# Patient Record
Sex: Female | Born: 1980 | Race: Black or African American | Hispanic: No | Marital: Married | Smoking: Never smoker
Health system: Southern US, Community
[De-identification: ages and names within clinical notes are randomized; demographics above are authoritative.]

## PROBLEM LIST (undated history)

## (undated) DIAGNOSIS — I729 Aneurysm of unspecified site: Secondary | ICD-10-CM

## (undated) DIAGNOSIS — I10 Essential (primary) hypertension: Secondary | ICD-10-CM

## (undated) HISTORY — DX: Essential (primary) hypertension: I10

## (undated) HISTORY — PX: SPLENECTOMY, TOTAL: SHX788

## (undated) HISTORY — DX: Aneurysm of unspecified site: I72.9

---

## 2000-11-11 ENCOUNTER — Ambulatory Visit (HOSPITAL_COMMUNITY): Admission: AD | Admit: 2000-11-11 | Discharge: 2000-11-11 | Payer: Self-pay | Admitting: Obstetrics and Gynecology

## 2000-11-25 ENCOUNTER — Inpatient Hospital Stay (HOSPITAL_COMMUNITY): Admission: AD | Admit: 2000-11-25 | Discharge: 2000-11-27 | Payer: Self-pay | Admitting: Obstetrics and Gynecology

## 2002-03-17 ENCOUNTER — Emergency Department (HOSPITAL_COMMUNITY): Admission: EM | Admit: 2002-03-17 | Discharge: 2002-03-17 | Payer: Self-pay | Admitting: Emergency Medicine

## 2002-04-06 ENCOUNTER — Emergency Department (HOSPITAL_COMMUNITY): Admission: EM | Admit: 2002-04-06 | Discharge: 2002-04-06 | Payer: Self-pay | Admitting: Emergency Medicine

## 2002-06-28 ENCOUNTER — Emergency Department (HOSPITAL_COMMUNITY): Admission: EM | Admit: 2002-06-28 | Discharge: 2002-06-28 | Payer: Self-pay | Admitting: Emergency Medicine

## 2003-11-28 ENCOUNTER — Ambulatory Visit (HOSPITAL_COMMUNITY): Admission: AD | Admit: 2003-11-28 | Discharge: 2003-11-28 | Payer: Self-pay | Admitting: Obstetrics and Gynecology

## 2004-01-13 ENCOUNTER — Ambulatory Visit (HOSPITAL_COMMUNITY): Admission: AD | Admit: 2004-01-13 | Discharge: 2004-01-13 | Payer: Self-pay | Admitting: Obstetrics and Gynecology

## 2004-01-19 ENCOUNTER — Ambulatory Visit (HOSPITAL_COMMUNITY): Admission: RE | Admit: 2004-01-19 | Discharge: 2004-01-19 | Payer: Self-pay | Admitting: Obstetrics and Gynecology

## 2004-03-21 ENCOUNTER — Inpatient Hospital Stay (HOSPITAL_COMMUNITY): Admission: AD | Admit: 2004-03-21 | Discharge: 2004-03-23 | Payer: Self-pay | Admitting: Obstetrics & Gynecology

## 2004-11-25 ENCOUNTER — Emergency Department (HOSPITAL_COMMUNITY): Admission: EM | Admit: 2004-11-25 | Discharge: 2004-11-25 | Payer: Self-pay | Admitting: Emergency Medicine

## 2007-10-23 ENCOUNTER — Other Ambulatory Visit: Admission: RE | Admit: 2007-10-23 | Discharge: 2007-10-23 | Payer: Self-pay | Admitting: Obstetrics and Gynecology

## 2008-03-15 ENCOUNTER — Inpatient Hospital Stay (HOSPITAL_COMMUNITY): Admission: AD | Admit: 2008-03-15 | Discharge: 2008-03-17 | Payer: Self-pay | Admitting: Obstetrics and Gynecology

## 2008-03-24 ENCOUNTER — Encounter: Payer: Self-pay | Admitting: Emergency Medicine

## 2008-03-24 ENCOUNTER — Inpatient Hospital Stay (HOSPITAL_COMMUNITY): Admission: AD | Admit: 2008-03-24 | Discharge: 2008-03-28 | Payer: Self-pay | Admitting: Neurosurgery

## 2008-05-10 ENCOUNTER — Encounter: Admission: RE | Admit: 2008-05-10 | Discharge: 2008-05-10 | Payer: Self-pay | Admitting: Neurosurgery

## 2010-04-23 ENCOUNTER — Emergency Department (HOSPITAL_COMMUNITY): Admission: EM | Admit: 2010-04-23 | Discharge: 2010-04-24 | Payer: Self-pay | Admitting: Emergency Medicine

## 2010-12-10 ENCOUNTER — Emergency Department (HOSPITAL_COMMUNITY)
Admission: EM | Admit: 2010-12-10 | Discharge: 2010-12-10 | Disposition: A | Discharge status: Home / Self Care | Payer: PRIVATE HEALTH INSURANCE | Attending: Emergency Medicine | Admitting: Emergency Medicine

## 2010-12-10 DIAGNOSIS — I10 Essential (primary) hypertension: Secondary | ICD-10-CM | POA: Insufficient documentation

## 2010-12-10 DIAGNOSIS — H9209 Otalgia, unspecified ear: Secondary | ICD-10-CM | POA: Insufficient documentation

## 2010-12-10 DIAGNOSIS — H6120 Impacted cerumen, unspecified ear: Secondary | ICD-10-CM | POA: Insufficient documentation

## 2010-12-10 DIAGNOSIS — D649 Anemia, unspecified: Secondary | ICD-10-CM | POA: Insufficient documentation

## 2010-12-15 NOTE — Discharge Summary (Signed)
[name], [name]            ACCOUNT NO.:  [id number]   MEDICAL RECORD NO.:  [id number]          PATIENT TYPE:  INP   LOCATION:  3021                         FACILITY:  [facility]   PHYSICIAN:  Cristi Loron, M.D.DATE OF BIRTH:  1981/05/30   DATE OF ADMISSION:  03/24/2008  DATE OF DISCHARGE:  03/28/2008                               DISCHARGE SUMMARY   BRIEF HISTORY:  The patient is a 30 year old black female who presented  with a subarachnoid hemorrhage to the emergency department on March 24, 2008.  She was admitted for further workup.   For further details of this admission, please refer to history and  physical.   HOSPITAL COURSE:  The patient was admitted to [facility] on  March 24, 2008, with a diagnosis of nontraumatic subarachnoid  hemorrhage.  The patient was worked up with a CT angiogram which did not  demonstrate any evidence of aneurysms, AVMs, etc. to explain her  subarachnoid rectal hemorrhage.   Dictation ended at this point.      Cristi Loron, M.D.  Electronically Signed     JDJ/MEDQ  D:  05/02/2008  T:  05/03/2008  Job:  [id number]

## 2010-12-15 NOTE — Op Note (Signed)
[name], [name]            ACCOUNT NO.:  [id number]   MEDICAL RECORD NO.:  [id number]          PATIENT TYPE:  INP   LOCATION:  9132                          FACILITY:  [facility]   PHYSICIAN:  Tilda Burrow, M.D. DATE OF BIRTH:  [birth year]   DATE OF PROCEDURE:  03/16/2008  DATE OF DISCHARGE:                               OPERATIVE REPORT   Delivery time 4:49 a.m. on March 16, 2008.   LABOR SUMMARY AND DELIVERY NOTE:  [name] was admitted shortly before  midnight at 40 weeks and 6 days for induction of labor due to impending  post date.  She was 3 cm dilated, long, -2 vertex at the time of  admission.  Shortly after midnight she was given a low-dose Pitocin  induction of labor and she received IV Stadol x2 and then IV Nubain x1.  She progressed nicely.  Scalp electrode as applied when membranes have  ruptured.  Amniotic fluid was noted later to have light meconium  discoloration.  She progressed rapidly after 4 a.m. and began to have  fetal bradycardia around 4:30 p.m. and was found anterior lip dilation.  Lip was able to be lifted out of the cervix and she pushed irresistibly  within 10 minutes to a spontaneous vertex vaginal delivery of an 8 pound  0 ounce female infant with Apgars of 9 and 9, delivered over an intact  perineum.  Bulb suctioning was easily performed and the baby cried  vigorously.  There was nuchal cord x1 slipped against the body.  The  infant's right shoulder was able to be identified and used to guide the  shoulder and the body past the introitus.  Cord blood gases were not  felt necessary given the excellent overall status of the infant.  Placenta delivered easily, shoulder presentation.  Had a normal size  placenta with a marginal insertion of the cord and chronic meconium  discoloration of the membranes and placental surface.  The infant went  to the regular nursery and mother required no lacerations with 250 mL of  EBL.      Tilda Burrow, M.D.  Electronically Signed     JVF/MEDQ  D:  03/16/2008  T:  03/17/2008  Job:  [id number]   cc:   [facility]   Francoise Schaumann. Milford Cage DO, FAAP  Fax: [phone]
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
## 2010-12-15 NOTE — Discharge Summary (Signed)
[name], [name]            ACCOUNT NO.:  [id number]   MEDICAL RECORD NO.:  [id number]          PATIENT TYPE:  INP   LOCATION:  3021                         FACILITY:  [facility]   PHYSICIAN:  Cristi Loron, M.D.DATE OF BIRTH:  02-28-1981   DATE OF ADMISSION:  03/24/2008  DATE OF DISCHARGE:  03/28/2008                               DISCHARGE SUMMARY   ADDENDUM:  The patient continued to have headaches.  She was observed  for several days.  We obtained a brain MRI which did not demonstrate any  clear cause for subarachnoid hemorrhage.   By March 28, 2008, the patient was afebrile.  Vital signs were stable.  Headache had been improving.  She was requesting discharge home.  She  was discharged home on April 28, 2008.   DISCHARGE INSTRUCTIONS:  The patient is instructed to follow up with me  in office in a month.  She was given written discharge instructions.   DISCHARGE PRESCRIPTIONS:  Percocet 10/325 #100 one p.o. q.4 h. p.r.n.  for pain.   FINAL DIAGNOSES:  Nontraumatic subarachnoid hemorrhage.   PROCEDURES PERFORMED:  None.      Cristi Loron, M.D.  Electronically Signed     JDJ/MEDQ  D:  05/02/2008  T:  05/03/2008  Job:  [id number]
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
## 2010-12-18 NOTE — Op Note (Signed)
[name], [name]                        ACCOUNT NO.:  [id number]   MEDICAL RECORD NO.:  [id number]                   PATIENT TYPE:  INP   LOCATION:  A427                                 FACILITY:  [facility]   PHYSICIAN:  Lazaro Arms, M.D.                DATE OF BIRTH:  05-Jul-1981   DATE OF PROCEDURE:  03/21/2004  DATE OF DISCHARGE:                                 OPERATIVE REPORT   DESCRIPTION OF PROCEDURE:  The patient is a 30 year old Gravida II, Para 1  who presented in the active phase of labor.  She has progressed normally  through the active phase.  She received a small amount of Pitocin  augmentation.  She was found to be complete and had the urge to push.   The patient had two maternal expulsive efforts and over an intact perineum  delivered a viable female at 12:30 with Apgars of 9 and 9 with a weight yet  to be determined.  There was a three vessel cord.  Cord blood and cord gas  were sent.  The placenta was normal and intact.  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  There was a small right periurethral laceration which was repaired with  three interrupted sutures.  The perineum was otherwise intact.  The uterus  was firm.  Blood loss from the delivery was 200 mL.  The patient is Rh  positive and Rubella immune.  She will undergo routine postpartum care.      ___________________________________________                                            Lazaro Arms, M.D.   LHE/MEDQ  D:  03/21/2004  T:  03/22/2004  Job:  [id number]

## 2011-04-30 LAB — CBC
MCHC: 32.2
MCV: 87.9
Platelets: 503 — ABNORMAL HIGH

## 2011-04-30 LAB — RPR: RPR Ser Ql: NONREACTIVE

## 2014-07-10 ENCOUNTER — Ambulatory Visit (INDEPENDENT_AMBULATORY_CARE_PROVIDER_SITE_OTHER): Payer: PRIVATE HEALTH INSURANCE | Admitting: Advanced Practice Midwife

## 2014-07-10 ENCOUNTER — Other Ambulatory Visit (HOSPITAL_COMMUNITY)
Admission: RE | Admit: 2014-07-10 | Discharge: 2014-07-10 | Disposition: A | Discharge status: Home / Self Care | Payer: PRIVATE HEALTH INSURANCE | Source: Ambulatory Visit | Attending: Advanced Practice Midwife | Admitting: Advanced Practice Midwife

## 2014-07-10 ENCOUNTER — Encounter: Payer: Self-pay | Admitting: Advanced Practice Midwife

## 2014-07-10 VITALS — BP 120/80 | Ht 62.5 in | Wt 194.0 lb

## 2014-07-10 DIAGNOSIS — Z01419 Encounter for gynecological examination (general) (routine) without abnormal findings: Secondary | ICD-10-CM | POA: Diagnosis present

## 2014-07-10 DIAGNOSIS — Z131 Encounter for screening for diabetes mellitus: Secondary | ICD-10-CM

## 2014-07-10 DIAGNOSIS — Z1151 Encounter for screening for human papillomavirus (HPV): Secondary | ICD-10-CM | POA: Diagnosis present

## 2014-07-10 DIAGNOSIS — Z13220 Encounter for screening for lipoid disorders: Secondary | ICD-10-CM

## 2014-07-10 DIAGNOSIS — I10 Essential (primary) hypertension: Secondary | ICD-10-CM | POA: Insufficient documentation

## 2014-07-10 DIAGNOSIS — Z0000 Encounter for general adult medical examination without abnormal findings: Secondary | ICD-10-CM

## 2014-07-10 DIAGNOSIS — Z1329 Encounter for screening for other suspected endocrine disorder: Secondary | ICD-10-CM

## 2014-07-10 MED ORDER — NORGESTIM-ETH ESTRAD TRIPHASIC 0.18/0.215/0.25 MG-25 MCG PO TABS: 1.0000 | ORAL_TABLET | Freq: Every day | ORAL | Status: DC

## 2014-07-10 MED ORDER — HYDROCHLOROTHIAZIDE 12.5 MG PO CAPS: 12.5000 mg | ORAL_CAPSULE | Freq: Every day | ORAL | Status: DC

## 2014-07-10 NOTE — Progress Notes (Signed)
[name] 33 y.o.  Filed Vitals:   07/10/14 0848  BP: 120/80     Past Medical History: Past Medical History  Diagnosis Date  . Aneurysm   . Hypertension     Past Surgical History: Past Surgical History  Procedure Laterality Date  . Splenectomy, total      Family History: Family History  Problem Relation Age of Onset  . Diabetes Mother   . Hypertension Mother   . Diabetes Father   . Hypertension Father   . Asthma Daughter     Social History: History  Substance Use Topics  . Smoking status: Never Smoker   . Smokeless tobacco: Never Used  . Alcohol Use: No    Allergies: No Known Allergies   Current outpatient prescriptions: hydrochlorothiazide (MICROZIDE) 12.5 MG capsule, Take 12.5 mg by mouth daily., Disp: , Rfl:   History of Present Illness: Here for pap and physical.  No C/O.  Husband getting out of jail in a few weeks, wants to start COC's.  Aware that she will have to use condoms for the first month.  Has been on HCTZ 12.5mg  for HTN for 15 years (Dr. Sudie BaileyKnowlton).  Requests refills.   Review of Systems   Patient denies any headaches, blurred vision, shortness of breath, chest pain, abdominal pain, problems with bowel movements, urination, or intercourse.   Physical Exam: General:  Well developed, well nourished, no acute distress Skin:  Warm and dry Neck:  Midline trachea, normal thyroid Lungs; Clear to auscultation bilaterally Breast:  No dominant palpable mass, retraction, or nipple discharge Cardiovascular: Regular rate and rhythm Abdomen:  Soft, non tender, no hepatosplenomegaly Pelvic:  External genitalia is normal in appearance.  The vagina is normal in appearance.  The cervix is bulbous.  Uterus is felt to be normal size, shape, and contour.  No adnexal masses or tenderness noted.  Extremities:  No swelling or varicosities noted Psych:  No mood changes.     Impression: Normal GYN exam Contraception management     Plan: Start  Ortho Tricyclen Lo with next period  TSH, HgbA1C, CMP, CBC, Lipid profile (Lab backed up and pt couldn't wait)--will need to make an appt for 3 months for med check

## 2014-07-11 LAB — CYTOLOGY - PAP

## 2014-07-18 ENCOUNTER — Other Ambulatory Visit: Payer: Self-pay | Admitting: Adult Health

## 2014-07-24 ENCOUNTER — Telehealth: Payer: Self-pay | Admitting: Advanced Practice Midwife

## 2014-07-24 MED ORDER — NORGESTIMATE-ETH ESTRADIOL 0.25-35 MG-MCG PO TABS: 1.0000 | ORAL_TABLET | Freq: Every day | ORAL | Status: DC

## 2014-07-24 NOTE — Telephone Encounter (Signed)
sprintec called in..Marland Kitchen

## 2014-07-24 NOTE — Telephone Encounter (Signed)
Pt is wanting a cheaper BCP, she states her insurance doesn't cover much of the cost and the pharmacist recommended she call us and ask for a generic to be prescribed.fr

## 2015-08-07 ENCOUNTER — Other Ambulatory Visit: Payer: Self-pay | Admitting: Advanced Practice Midwife

## 2015-08-23 ENCOUNTER — Other Ambulatory Visit: Payer: Self-pay | Admitting: Advanced Practice Midwife

## 2015-09-30 ENCOUNTER — Other Ambulatory Visit: Payer: Self-pay | Admitting: *Deleted

## 2015-10-01 MED ORDER — NORGESTIMATE-ETH ESTRADIOL 0.25-35 MG-MCG PO TABS: 1.0000 | ORAL_TABLET | Freq: Every day | ORAL | Status: DC

## 2015-10-08 ENCOUNTER — Other Ambulatory Visit: Payer: Self-pay | Admitting: Advanced Practice Midwife

## 2015-10-29 ENCOUNTER — Telehealth: Payer: Self-pay | Admitting: Women's Health

## 2015-10-29 ENCOUNTER — Other Ambulatory Visit: Payer: PRIVATE HEALTH INSURANCE | Admitting: Women's Health

## 2015-10-29 ENCOUNTER — Other Ambulatory Visit: Payer: Self-pay | Admitting: Advanced Practice Midwife

## 2015-10-29 NOTE — Telephone Encounter (Signed)
Pt informed Rx for Sprintec e-scribed on 10/01/2015 with 6 refills. Pt verbalized understanding.

## 2015-10-29 NOTE — Telephone Encounter (Signed)
Pt called stating that she had to reschedule her appointment because she has started her period, Pt would like to know if we could refill her medication until her next appointment which is for 11/03/2015 @ 10:45. Please contact pt

## 2015-11-03 ENCOUNTER — Ambulatory Visit (INDEPENDENT_AMBULATORY_CARE_PROVIDER_SITE_OTHER): Payer: PRIVATE HEALTH INSURANCE | Admitting: Women's Health

## 2015-11-03 ENCOUNTER — Encounter: Payer: Self-pay | Admitting: Women's Health

## 2015-11-03 VITALS — BP 112/60 | HR 80 | Ht 62.0 in | Wt 198.0 lb

## 2015-11-03 DIAGNOSIS — Z01419 Encounter for gynecological examination (general) (routine) without abnormal findings: Secondary | ICD-10-CM | POA: Diagnosis not present

## 2015-11-03 DIAGNOSIS — L723 Sebaceous cyst: Secondary | ICD-10-CM | POA: Insufficient documentation

## 2015-11-03 HISTORY — DX: Sebaceous cyst: L72.3

## 2015-11-03 NOTE — Progress Notes (Signed)
Patient ID: [name], female   DOB: 1981-06-22, [age] y.o.   MRN: [id number] Subjective:   [name] is a 35 y.o. African American female here for a routine well-woman exam.  Patient's last menstrual period was 09/29/2015.    Current complaints: knot under Rt armpit x few months- no changes, not painful PCP: Dr. 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  Past Medical History Past Medical History  Diagnosis Date  . Aneurysm (HCC)   . Hypertension     Past Surgical History Past Surgical History  Procedure Laterality Date  . Splenectomy, total      Gynecologic History No obstetric history on file.  Patient's last menstrual period was 09/29/2015. Contraception: OCP (estrogen/progesterone) Last Pap: 07/10/14. Results were: normal Last mammogram: never. Results were: n/a Last TCS: never  Obstetric History OB History  No data available    Current Medications Current Outpatient Prescriptions on File Prior to Visit  Medication Sig Dispense Refill  . hydrochlorothiazide (MICROZIDE) 12.5 MG capsule TAKE 1 CAPSULE BY MOUTH ONCE A DAY FOR HIGH BLOOD PRESSURE. 30 capsule 6  . norgestimate-ethinyl estradiol (SPRINTEC 28) 0.25-35 MG-MCG tablet Take 1  tablet by mouth daily. 28 tablet 6  . Norgestimate-Ethinyl Estradiol Triphasic 0.18/0.215/0.25 MG-25 MCG tab Take 1 tablet by mouth daily. (Patient not taking: Reported on 11/03/2015) 1 Package 11   No current facility-administered medications on file prior to visit.    Review of Systems Patient denies any headaches, blurred vision, shortness of breath, chest pain, abdominal pain, problems with bowel movements, urination, or intercourse.  Objective:  BP 112/60 mmHg  Pulse 80  Ht 5\' 2"  (1.575 m)  Wt 198 lb (89.812 kg)  BMI 36.21 kg/m2  LMP 09/29/2015 Physical Exam  General:  Well developed, well nourished, no acute distress. She is alert and oriented x3. Skin:  Warm and dry Neck:  Midline trachea, no thyromegaly or nodules Cardiovascular: Regular rate and rhythm, no murmur heard Lungs:  Effort normal, all lung fields clear to auscultation bilaterally Breasts:  No dominant palpable mass, retraction, or nipple discharge ~0.5cm round hard mobile cyst under Rt axilla, able to pick it up- does not feel like lymph node Abdomen:  Soft, non tender, no hepatosplenomegaly or masses Pelvic:  External genitalia is normal in appearance.  The vagina is normal in appearance. The cervix is bulbous, no CMT.  Thin prep pap is not done . Uterus is felt to be normal size, shape, and contour.  No adnexal masses or tenderness noted. Extremities:  No swelling or varicosities noted Psych:  She has a normal mood and affect   Assessment:   Healthy well-woman exam Sebacceous cyst Rt axilla CHTN  Plan:  Fasting CBC, CMP, TSH, A1C, Lipid panel in am Let us know if cyst changing F/U 1180yr for pap &  physical, or sooner if needed Mammogram  or sooner if problems Colonoscopy  or sooner if problems  Marge Duncans CNM, WHNP-BC 11/03/2015 11:30 AM

## 2015-11-03 NOTE — Patient Instructions (Signed)
Lab [facility]   Mon-Friday 8am-5pm (close daily 12:30-1pm for lunch), Sat 8am-12pm

## 2016-03-19 ENCOUNTER — Other Ambulatory Visit: Payer: Self-pay | Admitting: Advanced Practice Midwife

## 2016-05-05 ENCOUNTER — Telehealth (HOSPITAL_COMMUNITY): Payer: Self-pay

## 2016-05-05 NOTE — Telephone Encounter (Signed)
PT l/m to call and schedule a new pt appt / Attempted to call pt about an appt / No answer and not able to l/m on v/mail.

## 2016-10-02 ENCOUNTER — Other Ambulatory Visit: Payer: Self-pay | Admitting: Advanced Practice Midwife

## 2016-11-19 ENCOUNTER — Other Ambulatory Visit: Payer: Self-pay | Admitting: Advanced Practice Midwife

## 2017-01-29 ENCOUNTER — Other Ambulatory Visit: Payer: Self-pay | Admitting: Advanced Practice Midwife

## 2017-02-12 ENCOUNTER — Other Ambulatory Visit: Payer: Self-pay | Admitting: Advanced Practice Midwife

## 2017-09-28 ENCOUNTER — Other Ambulatory Visit: Payer: Self-pay | Admitting: Advanced Practice Midwife

## 2017-10-03 ENCOUNTER — Other Ambulatory Visit: Payer: Self-pay | Admitting: *Deleted

## 2017-10-03 MED ORDER — NORGESTIMATE-ETH ESTRADIOL 0.25-35 MG-MCG PO TABS: 1.0000 | ORAL_TABLET | Freq: Every day | ORAL | 0 refills | Status: DC

## 2017-10-04 NOTE — Telephone Encounter (Signed)
Attempted to call pt to let her know that one refill for sprintec had been sent in to pharmacy. She will need to schedule appt for physical for any more refills.

## 2017-10-27 ENCOUNTER — Other Ambulatory Visit: Payer: Self-pay | Admitting: Advanced Practice Midwife

## 2017-10-27 ENCOUNTER — Telehealth: Payer: Self-pay | Admitting: Advanced Practice Midwife

## 2017-10-27 ENCOUNTER — Other Ambulatory Visit: Payer: PRIVATE HEALTH INSURANCE | Admitting: Advanced Practice Midwife

## 2017-10-27 MED ORDER — NORGESTIMATE-ETH ESTRADIOL 0.25-35 MG-MCG PO TABS: 1.0000 | ORAL_TABLET | Freq: Every day | ORAL | 0 refills | Status: DC

## 2017-10-27 NOTE — Telephone Encounter (Signed)
Patient called stating that she had to reschedule her appointment for today because she has started her menstrual, pt would like to know if we could call [facility] next appointment which is 11/03/2017. Pt states that she uses [facility]. Please contact pt

## 2017-11-03 ENCOUNTER — Encounter: Payer: Self-pay | Admitting: Advanced Practice Midwife

## 2017-11-03 ENCOUNTER — Other Ambulatory Visit (HOSPITAL_COMMUNITY)
Admission: RE | Admit: 2017-11-03 | Discharge: 2017-11-03 | Disposition: A | Discharge status: Home / Self Care | Payer: 59 | Source: Ambulatory Visit | Attending: Advanced Practice Midwife | Admitting: Advanced Practice Midwife

## 2017-11-03 ENCOUNTER — Ambulatory Visit (INDEPENDENT_AMBULATORY_CARE_PROVIDER_SITE_OTHER): Payer: 59 | Admitting: Advanced Practice Midwife

## 2017-11-03 VITALS — BP 126/90 | HR 81 | Ht 63.0 in | Wt 200.0 lb

## 2017-11-03 DIAGNOSIS — Z1329 Encounter for screening for other suspected endocrine disorder: Secondary | ICD-10-CM

## 2017-11-03 DIAGNOSIS — Z131 Encounter for screening for diabetes mellitus: Secondary | ICD-10-CM

## 2017-11-03 DIAGNOSIS — Z13220 Encounter for screening for lipoid disorders: Secondary | ICD-10-CM | POA: Diagnosis not present

## 2017-11-03 DIAGNOSIS — Z01419 Encounter for gynecological examination (general) (routine) without abnormal findings: Secondary | ICD-10-CM

## 2017-11-03 MED ORDER — NORGESTIMATE-ETH ESTRADIOL 0.25-35 MG-MCG PO TABS: 1.0000 | ORAL_TABLET | Freq: Every day | ORAL | 12 refills | Status: DC

## 2017-11-03 NOTE — Progress Notes (Signed)
[name] 37 y.o.  Vitals:   11/03/17 1528  BP: 126/90  Pulse: 81     Filed Weights   11/03/17 1528  Weight: 200 lb (90.7 kg)    Past Medical History: Past Medical History:  Diagnosis Date  . Aneurysm (HCC)   . Hypertension     Past Surgical History: Past Surgical History:  Procedure Laterality Date  . SPLENECTOMY, TOTAL      Family History: Family History  Problem Relation Age of Onset  . Diabetes Mother   . Hypertension Mother   . Diabetes Father   . Hypertension Father   . Asthma Daughter     Social History: Social History   Tobacco Use  . Smoking status: Never Smoker  . Smokeless tobacco: Never Used  Substance Use Topics  . Alcohol use: No    Alcohol/week: 0.0 oz  . Drug use: No    Allergies: No Known Allergies    Current Outpatient Medications:  .  norgestimate-ethinyl estradiol (SPRINTEC 28) 0.25-35 MG-MCG tablet, Take 1 tablet by mouth daily., Disp: 28 tablet, Rfl: 0 .  hydrochlorothiazide (MICROZIDE) 12.5 MG capsule, TAKE 1 CAPSULE BY MOUTH ONCE A DAY FOR HIGH BLOOD PRESSURE. (Patient not taking: Reported on 11/03/2017), Disp: 30 capsule, Rfl: 6 .  Norgestimate-Ethinyl Estradiol Triphasic 0.18/0.215/0.25 MG-25 MCG tab, Take 1 tablet by mouth daily. (Patient not taking: Reported on 11/03/2015), Disp: 1 Package, Rfl: 11 .  phentermine 37.5 MG capsule, Take 37.5 mg by mouth every morning., Disp: , Rfl:   History of Present Illness: Here for pap Last pap end of 2015.    Review of Systems   Patient denies any headaches, blurred vision, shortness of breath, chest pain, abdominal pain, problems with bowel movements, urination, or intercourse.   Physical Exam: General:  Well developed, well nourished, no acute distress Skin:  Warm and dry Neck:  Midline trachea, normal thyroid Lungs; Clear to auscultation bilaterally Breast:  No dominant palpable mass, retraction, or nipple discharge Cardiovascular: Regular rate and rhythm Abdomen:   Soft, non tender, no hepatosplenomegaly Pelvic:  External genitalia is normal in appearance.  The vagina is normal in appearance.  The cervix is bulbous.  Uterus is felt to be normal size, shape, and contour.  No adnexal masses or tenderness noted.  Extremities:  No swelling or varicosities noted Psych:  No mood changes.     Impression: normal Pap and physical     Plan: if normal, repeat q 3 years Screening labs

## 2017-11-03 NOTE — Patient Instructions (Signed)
RepHresh a few days before your period.

## 2017-11-04 LAB — CBC
HEMATOCRIT: 32.1 % — AB (ref 34.0–46.6)
HEMOGLOBIN: 9.8 g/dL — AB (ref 11.1–15.9)
MCH: 25.7 pg — AB (ref 26.6–33.0)
MCHC: 30.5 g/dL — AB (ref 31.5–35.7)
MCV: 84 fL (ref 79–97)
Platelets: 507 10*3/uL — ABNORMAL HIGH (ref 150–379)
RBC: 3.82 x10E6/uL (ref 3.77–5.28)
RDW: 15.8 % — AB (ref 12.3–15.4)
WBC: 4.1 10*3/uL (ref 3.4–10.8)

## 2017-11-04 LAB — HEMOGLOBIN A1C
ESTIMATED AVERAGE GLUCOSE: 97 mg/dL
HEMOGLOBIN A1C: 5 % (ref 4.8–5.6)

## 2017-11-04 LAB — LIPID PANEL
CHOL/HDL RATIO: 3.3 ratio (ref 0.0–4.4)
Cholesterol, Total: 246 mg/dL — ABNORMAL HIGH (ref 100–199)
HDL: 74 mg/dL (ref >39)
LDL Calculated: 161 mg/dL — ABNORMAL HIGH (ref 0–99)
TRIGLYCERIDES: 55 mg/dL (ref 0–149)
VLDL Cholesterol Cal: 11 mg/dL (ref 5–40)

## 2017-11-04 LAB — COMPREHENSIVE METABOLIC PANEL
ALBUMIN: 3.8 g/dL (ref 3.5–5.5)
ALT: 8 IU/L (ref 0–32)
AST: 17 IU/L (ref 0–40)
Albumin/Globulin Ratio: 1.1 — ABNORMAL LOW (ref 1.2–2.2)
Alkaline Phosphatase: 70 IU/L (ref 39–117)
BILIRUBIN TOTAL: 0.5 mg/dL (ref 0.0–1.2)
BUN / CREAT RATIO: 11 (ref 9–23)
BUN: 8 mg/dL (ref 6–20)
CALCIUM: 9 mg/dL (ref 8.7–10.2)
CHLORIDE: 101 mmol/L (ref 96–106)
CO2: 21 mmol/L (ref 20–29)
CREATININE: 0.74 mg/dL (ref 0.57–1.00)
GFR, EST AFRICAN AMERICAN: 120 mL/min/{1.73_m2} (ref >59)
GFR, EST NON AFRICAN AMERICAN: 104 mL/min/{1.73_m2} (ref >59)
GLUCOSE: 73 mg/dL (ref 65–99)
Globulin, Total: 3.6 g/dL (ref 1.5–4.5)
Potassium: 4.1 mmol/L (ref 3.5–5.2)
Sodium: 138 mmol/L (ref 134–144)
TOTAL PROTEIN: 7.4 g/dL (ref 6.0–8.5)

## 2017-11-04 LAB — TSH: TSH: 1.43 u[IU]/mL (ref 0.450–4.500)

## 2017-11-08 LAB — CYTOLOGY - PAP
Chlamydia: NEGATIVE
Diagnosis: NEGATIVE
HPV: NOT DETECTED
Neisseria Gonorrhea: NEGATIVE

## 2017-11-09 ENCOUNTER — Encounter (INDEPENDENT_AMBULATORY_CARE_PROVIDER_SITE_OTHER): Payer: Self-pay

## 2017-11-23 ENCOUNTER — Encounter: Payer: Self-pay | Admitting: Advanced Practice Midwife

## 2017-11-23 NOTE — Progress Notes (Signed)
Saw pt at a track meet and went over labs. Advised fish oil. Pt agreed.

## 2018-10-17 ENCOUNTER — Other Ambulatory Visit: Payer: Self-pay | Admitting: Advanced Practice Midwife

## 2018-11-16 ENCOUNTER — Telehealth: Payer: Self-pay | Admitting: Advanced Practice Midwife

## 2018-11-16 NOTE — Telephone Encounter (Signed)
Pt states that she will be out of birth control next month and is wanting to see if she needs an appt or if a refill can be sent in to [facility]?

## 2018-11-20 ENCOUNTER — Other Ambulatory Visit: Payer: Self-pay

## 2018-11-20 ENCOUNTER — Encounter: Payer: Self-pay | Admitting: Women's Health

## 2018-11-20 ENCOUNTER — Ambulatory Visit (INDEPENDENT_AMBULATORY_CARE_PROVIDER_SITE_OTHER): Payer: 59 | Admitting: Women's Health

## 2018-11-20 VITALS — Ht 64.0 in | Wt 196.0 lb

## 2018-11-20 DIAGNOSIS — Z3041 Encounter for surveillance of contraceptive pills: Secondary | ICD-10-CM

## 2018-11-20 MED ORDER — NORGESTIM-ETH ESTRAD TRIPHASIC 0.18/0.215/0.25 MG-25 MCG PO TABS: 1.0000 | ORAL_TABLET | Freq: Every day | ORAL | 11 refills | Status: DC

## 2018-11-20 NOTE — Progress Notes (Signed)
   TELEHEALTH VIRTUAL GYN VISIT ENCOUNTER NOTE Patient name: [name] MRN [id number]  Date of birth: [birth year]  I connected with patient on 11/20/18 at  8:45 AM EDT by phone (couldn't get her webex to work) and verified that I am speaking with the correct person using two identifiers.  Due to COVID-19 recommendations, pt is not currently in the office.    I discussed the limitations, risks, security and privacy concerns of performing an evaluation and management service by telephone and the availability of in person appointments. I also discussed with the patient that there may be a patient responsible charge related to this service. The patient expressed understanding and agreed to proceed.   Chief Complaint:   Medication Refill (Sprintec)  History of Present Illness:   [name] is a 38 y.o.  African American female being evaluated today for contraception management. Needs refill on Sprintec, doing well, no problems. H/O HTN, not on meds for awhile, bp's have been normal, no headaches. Does not smoke, no h/o DVT/PE, CVA, MI, or migraines w/ aura.      Patient's last menstrual period was 10/25/2018. The current method of family planning is OCP (estrogen/progesterone). Last pap April 2019. Results were:  normal Review of Systems:   Pertinent items are noted in HPI Denies fever/chills, dizziness, headaches, visual disturbances, fatigue, shortness of breath, chest pain, abdominal pain, vomiting, abnormal vaginal discharge/itching/odor/irritation, problems with periods, bowel movements, urination, or intercourse unless otherwise stated above.  Pertinent History Reviewed:  Reviewed past medical,surgical, social, obstetrical and family history.  Reviewed problem list, medications and allergies. Physical Assessment:   Vitals:   11/20/18 0900  Weight: 196 lb (88.9 kg)  Height: 5\' 4"  (1.626 m)  Body mass index is 33.64 kg/m.       Physical Examination:   General:   Alert, oriented and cooperative.   Mental Status: Normal mood and affect perceived. Normal judgment and thought content.  Physical exam deferred due to nature of the encounter  No results found for this or any previous visit (from the past 24 hour(s)).  Assessment & Plan:  1) Contraception management> refilled sprintec x 74yr  Meds:  Meds ordered this encounter  Medications  . Norgestimate-Ethinyl Estradiol Triphasic 0.18/0.215/0.25 MG-25 MCG tab    Sig: Take 1 tablet by mouth daily.    Dispense:  1 Package    Refill:  11    Order Specific Question:   Supervising Provider    Answer:   Despina Hidden, LUTHER H [[id number]]    No orders of the defined types were placed in this encounter.   I discussed the assessment and treatment plan with the patient. The patient was provided an opportunity to ask questions and all were answered. The patient agreed with the plan and demonstrated an understanding of the instructions.   The patient was advised to call back or seek an in-person evaluation/go to the ED if the symptoms worsen or if the condition fails to improve as anticipated.  I provided 8 minutes of non-face-to-face time during this encounter.   Return in about 1 year (around 11/20/2019) for Physical.  Cheral Marker CNM, WHNP-BC 11/20/2018 9:22 AM

## 2019-09-28 ENCOUNTER — Other Ambulatory Visit: Payer: Self-pay | Admitting: Advanced Practice Midwife

## 2019-10-03 ENCOUNTER — Telehealth: Payer: Self-pay | Admitting: *Deleted

## 2019-10-03 ENCOUNTER — Encounter: Payer: Self-pay | Admitting: *Deleted

## 2019-10-03 NOTE — Telephone Encounter (Signed)
Pt aware fax did not go through to [facility]. Pt will come by office and pick up Friday. JSY

## 2019-10-03 NOTE — Telephone Encounter (Signed)
Faxed note to [facility]. Unable to let pt know. Her voicemail was not set up. JSY

## 2019-10-03 NOTE — Telephone Encounter (Signed)
Pt states that her insurance company [employer] needs a note on letter head stating that she uses her birth control pill for birth control only so that she can get it for free.

## 2019-10-03 NOTE — Telephone Encounter (Signed)
Pt aware of below message. 

## 2020-02-11 ENCOUNTER — Emergency Department (HOSPITAL_COMMUNITY)
Admission: EM | Admit: 2020-02-11 | Discharge: 2020-02-11 | Disposition: A | Discharge status: Home / Self Care | Payer: 59 | Source: Home / Self Care | Attending: Emergency Medicine | Admitting: Emergency Medicine

## 2020-02-11 ENCOUNTER — Encounter (HOSPITAL_COMMUNITY): Payer: Self-pay | Admitting: Emergency Medicine

## 2020-02-11 ENCOUNTER — Emergency Department (HOSPITAL_COMMUNITY): Payer: 59

## 2020-02-11 ENCOUNTER — Other Ambulatory Visit: Payer: Self-pay

## 2020-02-11 DIAGNOSIS — R1013 Epigastric pain: Secondary | ICD-10-CM | POA: Diagnosis not present

## 2020-02-11 DIAGNOSIS — J069 Acute upper respiratory infection, unspecified: Secondary | ICD-10-CM | POA: Insufficient documentation

## 2020-02-11 DIAGNOSIS — J398 Other specified diseases of upper respiratory tract: Secondary | ICD-10-CM

## 2020-02-11 DIAGNOSIS — K56600 Partial intestinal obstruction, unspecified as to cause: Secondary | ICD-10-CM | POA: Diagnosis not present

## 2020-02-11 DIAGNOSIS — I10 Essential (primary) hypertension: Secondary | ICD-10-CM | POA: Insufficient documentation

## 2020-02-11 MED ORDER — BENZONATATE 200 MG PO CAPS: 200.0000 mg | ORAL_CAPSULE | Freq: Three times a day (TID) | ORAL | 0 refills | Status: DC | PRN

## 2020-02-11 NOTE — Discharge Instructions (Addendum)
Your chest x-ray today did not show evidence of infection or or pneumonia.  Your cough is felt to be likely to postnasal drip.  Try taking over-the-counter Sudafed as directed for 3 to 4 days.  You may also use an over-the-counter steroid nasal spray such as Flonase.  Drink plenty of fluids.  Follow-up with your primary doctor or return to the emergency department if you develop any worsening symptoms.

## 2020-02-11 NOTE — ED Triage Notes (Signed)
Pt reports cough and congestion since Wed, clear, thin sputum; denies fevers

## 2020-02-11 NOTE — ED Provider Notes (Signed)
[facility] EMERGENCY DEPARTMENT Provider Note   CSN: [id number] Arrival date & time: 02/11/20  1320     History Chief Complaint  Patient presents with  . Cough    [name] is a 39 y.o. female.  HPI      [name] is a 39 y.o. female who presents to the Emergency Department complaining of cough and nasal congestion for several days.  She states her cough has been mostly nonproductive and triggered by lying down and food intake.  She endorses some clear rhinorrhea.  No fevers, chest pain, or shortness of breath.  She states that her symptoms began improving over the weekend.  She also denies any abdominal pain, nausea, vomiting, diarrhea, loss of taste or smell and known Covid exposures.  Patient has not been vaccinated for Covid.    Past Medical History:  Diagnosis Date  . Aneurysm (HCC)   . Hypertension     Patient Active Problem List   Diagnosis Date Noted  . Sebaceous cyst of right axilla 11/03/2015  . Hypertension     Past Surgical History:  Procedure Laterality Date  . SPLENECTOMY, TOTAL       OB History   No obstetric history on file.     Family History  Problem Relation Age of Onset  . Diabetes Mother   . Hypertension Mother   . Diabetes Father   . Hypertension Father   . Asthma Daughter     Social History   Tobacco Use  . Smoking status: Never Smoker  . Smokeless tobacco: Never Used  Vaping Use  . Vaping Use: Never used  Substance Use Topics  . Alcohol use: No    Alcohol/week: 0.0 standard drinks  . Drug use: No    Home Medications Prior to Admission medications   Medication Sig Start Date End Date Taking? Authorizing Provider  hydrochlorothiazide (MICROZIDE) 12.5 MG capsule TAKE 1 CAPSULE BY MOUTH ONCE A DAY FOR HIGH BLOOD PRESSURE. Patient not taking: Reported on 11/03/2017 10/29/15   Jacklyn Shell, CNM  phentermine 37.5 MG capsule Take 37.5 mg by mouth every morning.    [provider]    SPRINTEC 28 0.25-35 MG-MCG tablet TAKE ONE TABLET BY MOUTH DAILY. 11/16/18   Cresenzo-Dishmon, Scarlette Calico, CNM  TRI-LO-SPRINTEC 0.18/0.215/0.25 MG-25 MCG tab TAKE ONE TABLET BY MOUTH DAILY. 10/04/19   Cresenzo-Dishmon, Scarlette Calico, CNM    Allergies    Patient has no known allergies.  Review of Systems   Review of Systems  Constitutional: Negative for appetite change, chills and fever.  HENT: Positive for congestion and rhinorrhea. Negative for sore throat and trouble swallowing.   Respiratory: Positive for cough. Negative for chest tightness, shortness of breath and wheezing.   Cardiovascular: Negative for chest pain.  Gastrointestinal: Negative for abdominal pain, diarrhea, nausea and vomiting.  Genitourinary: Negative for dysuria.  Musculoskeletal: Negative for arthralgias and neck pain.  Skin: Negative for rash.  Neurological: Negative for dizziness, weakness and numbness.  Hematological: Negative for adenopathy.    Physical Exam Updated Vital Signs BP 116/88 (BP Location: Right Arm)   Pulse 79   Temp 98.6 F (37 C) (Oral)   Resp 18   Ht 5\' 2"  (1.575 m)   Wt 89.8 kg   LMP 01/14/2020   SpO2 100%   BMI 36.21 kg/m   Physical Exam Vitals and nursing note reviewed.  Constitutional:      General: She is not in acute distress.    Appearance: Normal appearance. She is not ill-appearing.  HENT:     Right Ear: Tympanic membrane and ear canal normal.     Left Ear: Tympanic membrane and ear canal normal.  Cardiovascular:     Rate and Rhythm: Normal rate and regular rhythm.     Pulses: Normal pulses.  Pulmonary:     Effort: Pulmonary effort is normal. No respiratory distress.     Breath sounds: Normal breath sounds. No wheezing.  Chest:     Chest wall: No tenderness.  Musculoskeletal:        General: Normal range of motion.     Cervical back: Normal range of motion.     Right lower leg: No edema.     Left lower leg: No edema.  Skin:    General: Skin is warm.     Capillary  Refill: Capillary refill takes less than 2 seconds.     Findings: No rash.  Neurological:     General: No focal deficit present.     Mental Status: She is alert.     ED Results / Procedures / Treatments   Labs (all labs ordered are listed, but only abnormal results are displayed) Labs Reviewed - No data to display  EKG None  Radiology DG Chest 2 View  Result Date: 02/11/2020 CLINICAL DATA:  Cough and congestion for most 1 week. EXAM: CHEST - 2 VIEW COMPARISON:  None. FINDINGS: Lungs clear. Heart size normal. No pneumothorax or pleural fluid. No acute bony abnormality. Remote left rib fractures noted. IMPRESSION: No acute disease. Electronically Signed   By: Drusilla Kanner M.D.   On: 02/11/2020 14:58    Procedures Procedures (including critical care time)  Medications Ordered in ED Medications - No data to display  ED Course  I have reviewed the triage vital signs and the nursing notes.  Pertinent labs & imaging results that were available during my care of the patient were reviewed by me and considered in my medical decision making (see chart for details).    MDM Rules/Calculators/A&P                          Patient well-appearing, nontoxic.  Vital signs reassuring.  No fever, hypoxia or tachycardia.  No concerning symptoms for PE.  Chest x-ray without acute disease.  Nonproductive cough associated with food intake and lying supine.  Likely secondary to postnasal drip.  No history of hypertension, recommended over-the-counter decongestant and steroid nasal spray.  Prescription written for [facility] as needed.  Patient was offered Covid testing but she declined.  I feel she is appropriate for discharge home, agrees to outpatient follow-up and return precautions given.   Final Clinical Impression(s) / ED Diagnoses Final diagnoses:  Congestion of upper respiratory tract    Rx / DC Orders ED Discharge Orders    None       Pauline Aus, PA-C 02/11/20  1731    Vanetta Mulders, MD 02/13/20 1645

## 2020-02-13 ENCOUNTER — Encounter (HOSPITAL_COMMUNITY): Payer: Self-pay | Admitting: Emergency Medicine

## 2020-02-13 ENCOUNTER — Inpatient Hospital Stay (HOSPITAL_COMMUNITY)
Admission: EM | Admit: 2020-02-13 | Discharge: 2020-02-18 | DRG: 390 | Disposition: A | Discharge status: Home / Self Care | Payer: 59 | Attending: General Surgery | Admitting: General Surgery

## 2020-02-13 ENCOUNTER — Other Ambulatory Visit: Payer: Self-pay

## 2020-02-13 DIAGNOSIS — Z833 Family history of diabetes mellitus: Secondary | ICD-10-CM

## 2020-02-13 DIAGNOSIS — K56609 Unspecified intestinal obstruction, unspecified as to partial versus complete obstruction: Secondary | ICD-10-CM | POA: Diagnosis present

## 2020-02-13 DIAGNOSIS — D75839 Thrombocytosis, unspecified: Secondary | ICD-10-CM

## 2020-02-13 DIAGNOSIS — Z20822 Contact with and (suspected) exposure to covid-19: Secondary | ICD-10-CM | POA: Diagnosis present

## 2020-02-13 DIAGNOSIS — R7989 Other specified abnormal findings of blood chemistry: Secondary | ICD-10-CM | POA: Diagnosis present

## 2020-02-13 DIAGNOSIS — R109 Unspecified abdominal pain: Secondary | ICD-10-CM

## 2020-02-13 DIAGNOSIS — Z79899 Other long term (current) drug therapy: Secondary | ICD-10-CM

## 2020-02-13 DIAGNOSIS — I10 Essential (primary) hypertension: Secondary | ICD-10-CM | POA: Diagnosis present

## 2020-02-13 DIAGNOSIS — Z8249 Family history of ischemic heart disease and other diseases of the circulatory system: Secondary | ICD-10-CM

## 2020-02-13 DIAGNOSIS — D509 Iron deficiency anemia, unspecified: Secondary | ICD-10-CM | POA: Diagnosis present

## 2020-02-13 DIAGNOSIS — K56600 Partial intestinal obstruction, unspecified as to cause: Principal | ICD-10-CM | POA: Diagnosis present

## 2020-02-13 DIAGNOSIS — Z9081 Acquired absence of spleen: Secondary | ICD-10-CM

## 2020-02-13 DIAGNOSIS — Z825 Family history of asthma and other chronic lower respiratory diseases: Secondary | ICD-10-CM

## 2020-02-13 LAB — COMPREHENSIVE METABOLIC PANEL
ALT: 12 U/L (ref 0–44)
AST: 23 U/L (ref 15–41)
Albumin: 3.4 g/dL — ABNORMAL LOW (ref 3.5–5.0)
Alkaline Phosphatase: 64 U/L (ref 38–126)
Anion gap: 14 (ref 5–15)
BUN: 11 mg/dL (ref 6–20)
CO2: 22 mmol/L (ref 22–32)
Calcium: 9 mg/dL (ref 8.9–10.3)
Chloride: 100 mmol/L (ref 98–111)
Creatinine, Ser: 0.65 mg/dL (ref 0.44–1.00)
GFR calc Af Amer: >60 mL/min (ref >60)
GFR calc non Af Amer: >60 mL/min (ref >60)
Glucose, Bld: 110 mg/dL — ABNORMAL HIGH (ref 70–99)
Potassium: 3.5 mmol/L (ref 3.5–5.1)
Sodium: 136 mmol/L (ref 135–145)
Total Bilirubin: 0.3 mg/dL (ref 0.3–1.2)
Total Protein: 8.2 g/dL — ABNORMAL HIGH (ref 6.5–8.1)

## 2020-02-13 LAB — CBC
HCT: 34.3 % — ABNORMAL LOW (ref 36.0–46.0)
Hemoglobin: 10 g/dL — ABNORMAL LOW (ref 12.0–15.0)
MCH: 23.8 pg — ABNORMAL LOW (ref 26.0–34.0)
MCHC: 29.2 g/dL — ABNORMAL LOW (ref 30.0–36.0)
MCV: 81.5 fL (ref 80.0–100.0)
Platelets: 310 10*3/uL (ref 150–400)
RBC: 4.21 MIL/uL (ref 3.87–5.11)
RDW: 16.9 % — ABNORMAL HIGH (ref 11.5–15.5)
WBC: 8.2 10*3/uL (ref 4.0–10.5)
nRBC: 0 % (ref 0.0–0.2)

## 2020-02-13 LAB — LIPASE, BLOOD: Lipase: 19 U/L (ref 11–51)

## 2020-02-13 MED ORDER — SODIUM CHLORIDE 0.9% FLUSH: 3.0000 mL | Freq: Once | INTRAVENOUS | Status: DC

## 2020-02-13 NOTE — ED Triage Notes (Signed)
Pt c/o abdominal pain that began at 1900 today. Pt states she started menstruating today. Patient states no other household member became sick after eating except her. Vomited 2xs

## 2020-02-14 ENCOUNTER — Emergency Department (HOSPITAL_COMMUNITY): Payer: 59

## 2020-02-14 DIAGNOSIS — R7989 Other specified abnormal findings of blood chemistry: Secondary | ICD-10-CM | POA: Diagnosis present

## 2020-02-14 DIAGNOSIS — Z20822 Contact with and (suspected) exposure to covid-19: Secondary | ICD-10-CM | POA: Diagnosis present

## 2020-02-14 DIAGNOSIS — K56609 Unspecified intestinal obstruction, unspecified as to partial versus complete obstruction: Secondary | ICD-10-CM | POA: Diagnosis present

## 2020-02-14 DIAGNOSIS — Z79899 Other long term (current) drug therapy: Secondary | ICD-10-CM | POA: Diagnosis not present

## 2020-02-14 DIAGNOSIS — Z8249 Family history of ischemic heart disease and other diseases of the circulatory system: Secondary | ICD-10-CM | POA: Diagnosis not present

## 2020-02-14 DIAGNOSIS — K56600 Partial intestinal obstruction, unspecified as to cause: Secondary | ICD-10-CM | POA: Diagnosis present

## 2020-02-14 DIAGNOSIS — R109 Unspecified abdominal pain: Secondary | ICD-10-CM

## 2020-02-14 DIAGNOSIS — R1013 Epigastric pain: Secondary | ICD-10-CM | POA: Diagnosis present

## 2020-02-14 DIAGNOSIS — I10 Essential (primary) hypertension: Secondary | ICD-10-CM | POA: Diagnosis present

## 2020-02-14 DIAGNOSIS — D509 Iron deficiency anemia, unspecified: Secondary | ICD-10-CM | POA: Diagnosis present

## 2020-02-14 DIAGNOSIS — Z825 Family history of asthma and other chronic lower respiratory diseases: Secondary | ICD-10-CM | POA: Diagnosis not present

## 2020-02-14 DIAGNOSIS — Z9081 Acquired absence of spleen: Secondary | ICD-10-CM | POA: Diagnosis not present

## 2020-02-14 DIAGNOSIS — Z833 Family history of diabetes mellitus: Secondary | ICD-10-CM | POA: Diagnosis not present

## 2020-02-14 HISTORY — DX: Unspecified abdominal pain: R10.9

## 2020-02-14 HISTORY — DX: Unspecified intestinal obstruction, unspecified as to partial versus complete obstruction: K56.609

## 2020-02-14 LAB — CBC
HCT: 29.4 % — ABNORMAL LOW (ref 36.0–46.0)
Hemoglobin: 9.2 g/dL — ABNORMAL LOW (ref 12.0–15.0)
MCH: 24.6 pg — ABNORMAL LOW (ref 26.0–34.0)
MCHC: 31.3 g/dL (ref 30.0–36.0)
MCV: 78.6 fL — ABNORMAL LOW (ref 80.0–100.0)
Platelets: 498 10*3/uL — ABNORMAL HIGH (ref 150–400)
RBC: 3.74 MIL/uL — ABNORMAL LOW (ref 3.87–5.11)
RDW: 16.7 % — ABNORMAL HIGH (ref 11.5–15.5)
WBC: 9.2 10*3/uL (ref 4.0–10.5)
nRBC: 0 % (ref 0.0–0.2)

## 2020-02-14 LAB — URINALYSIS, ROUTINE W REFLEX MICROSCOPIC
Bilirubin Urine: NEGATIVE
Glucose, UA: NEGATIVE mg/dL
Ketones, ur: 20 mg/dL — AB
Leukocytes,Ua: NEGATIVE
Nitrite: NEGATIVE
Protein, ur: 100 mg/dL — AB
Specific Gravity, Urine: 1.029 (ref 1.005–1.030)
pH: 5 (ref 5.0–8.0)

## 2020-02-14 LAB — COMPREHENSIVE METABOLIC PANEL
ALT: 12 U/L (ref 0–44)
AST: 17 U/L (ref 15–41)
Albumin: 3 g/dL — ABNORMAL LOW (ref 3.5–5.0)
Alkaline Phosphatase: 61 U/L (ref 38–126)
Anion gap: 8 (ref 5–15)
BUN: 11 mg/dL (ref 6–20)
CO2: 24 mmol/L (ref 22–32)
Calcium: 8.7 mg/dL — ABNORMAL LOW (ref 8.9–10.3)
Chloride: 102 mmol/L (ref 98–111)
Creatinine, Ser: 0.59 mg/dL (ref 0.44–1.00)
GFR calc Af Amer: >60 mL/min (ref >60)
GFR calc non Af Amer: >60 mL/min (ref >60)
Glucose, Bld: 121 mg/dL — ABNORMAL HIGH (ref 70–99)
Potassium: 4 mmol/L (ref 3.5–5.1)
Sodium: 134 mmol/L — ABNORMAL LOW (ref 135–145)
Total Bilirubin: 0.3 mg/dL (ref 0.3–1.2)
Total Protein: 7.3 g/dL (ref 6.5–8.1)

## 2020-02-14 LAB — PROTIME-INR
INR: 1.1 (ref 0.8–1.2)
Prothrombin Time: 13.3 seconds (ref 11.4–15.2)

## 2020-02-14 LAB — PHOSPHORUS: Phosphorus: 3.5 mg/dL (ref 2.5–4.6)

## 2020-02-14 LAB — MAGNESIUM: Magnesium: 1.7 mg/dL (ref 1.7–2.4)

## 2020-02-14 LAB — SARS CORONAVIRUS 2 BY RT PCR (HOSPITAL ORDER, PERFORMED IN ~~LOC~~ HOSPITAL LAB): SARS Coronavirus 2: NEGATIVE

## 2020-02-14 LAB — POC URINE PREG, ED: Preg Test, Ur: NEGATIVE

## 2020-02-14 LAB — HIV ANTIBODY (ROUTINE TESTING W REFLEX): HIV Screen 4th Generation wRfx: NONREACTIVE

## 2020-02-14 LAB — APTT: aPTT: 27 seconds (ref 24–36)

## 2020-02-14 MED ORDER — BISACODYL 10 MG RE SUPP
10.0000 mg | Freq: Every morning | RECTAL | Status: DC
  Administered 2020-02-14: 10 mg via RECTAL
  Filled 2020-02-14 (×4): qty 1

## 2020-02-14 MED ORDER — IOHEXOL 300 MG/ML  SOLN
100.0000 mL | Freq: Once | INTRAMUSCULAR | Status: AC | PRN
  Administered 2020-02-14: 100 mL via INTRAVENOUS

## 2020-02-14 MED ORDER — MORPHINE SULFATE (PF) 4 MG/ML IV SOLN
4.0000 mg | Freq: Once | INTRAVENOUS | Status: AC
  Administered 2020-02-14: 4 mg via INTRAVENOUS
  Filled 2020-02-14: qty 1

## 2020-02-14 MED ORDER — SODIUM CHLORIDE 0.9 % IV SOLN: Freq: Once | INTRAVENOUS | Status: AC

## 2020-02-14 MED ORDER — SODIUM CHLORIDE 0.9 % IV BOLUS
1000.0000 mL | Freq: Once | INTRAVENOUS | Status: AC
  Administered 2020-02-14: 1000 mL via INTRAVENOUS

## 2020-02-14 MED ORDER — ONDANSETRON HCL 4 MG/2ML IJ SOLN: 4.0000 mg | Freq: Four times a day (QID) | INTRAMUSCULAR | Status: DC | PRN

## 2020-02-14 MED ORDER — MORPHINE SULFATE (PF) 2 MG/ML IV SOLN
2.0000 mg | INTRAVENOUS | Status: DC | PRN
  Administered 2020-02-14: 2 mg via INTRAVENOUS
  Filled 2020-02-14: qty 1

## 2020-02-14 MED ORDER — LORAZEPAM 2 MG/ML IJ SOLN
1.0000 mg | Freq: Once | INTRAMUSCULAR | Status: AC
  Administered 2020-02-14: 1 mg via INTRAVENOUS
  Filled 2020-02-14: qty 1

## 2020-02-14 MED ORDER — ONDANSETRON HCL 4 MG/2ML IJ SOLN
4.0000 mg | Freq: Once | INTRAMUSCULAR | Status: AC
  Administered 2020-02-14: 4 mg via INTRAVENOUS
  Filled 2020-02-14: qty 2

## 2020-02-14 NOTE — Progress Notes (Signed)
Received call from [employer] regarding daughter's leave from [employer]	[place]. Patient gave consent for me to speak with them about diagnosis. [employer] stated they will call back for update.

## 2020-02-14 NOTE — Progress Notes (Signed)
Patient transferred to general surgery service per Dr. Lovell Sheehan.  Hospitalist team will sign off.  No further recommendations at this time.

## 2020-02-14 NOTE — H&P (Signed)
History and Physical  [name]:[id number] DOB: 1980-12-16 DOA: 02/13/2020  Referring physician: Dione Booze MD PCP: Patient, No Pcp Per  Patient coming from: Home  Chief Complaint: Abdominal pain  HPI: [name] is a 39 y.o. female with medical history significant for hypertension who presents to the emergency department due to diffuse abdominal pain which started around 7 PM (about 30 minutes after eating taco/burrito), pain was rated as 10/10 on pain scale and this was associated with nausea and vomiting.  Pain was with no alleviating/aggravating factor.  Whole family had same meal, but she was the only one that was sick.  She denies fever, chills, chest pain, shortness of breath.  ED Course: In the emergency department, she was hemodynamically stable.  Work-up in the ED showed normal CBC and BMP, albumin 3.4, lipase 19, urinalysis was positive for protein and ketones but negative for UTI.  CT abdomen pelvis with contrast was done and showed small bowel obstruction with multiple dilated mid abdominal small bowel loops up to 13 mm diameter and the area both upstream and downstream approach transition points with consideration for possibility closed-loop obstruction.  Patient was treated with IV morphine, IV Zofran and IV Ativan.  IV hydration 1 L of NS was provided.  Hospitalist was asked to admit patient for further evaluation and management.  Review of Systems: Constitutional: Negative for chills and fever.  HENT: Negative for ear pain and sore throat.   Eyes: Negative for pain and visual disturbance.  Respiratory: Negative for cough, chest tightness and shortness of breath.   Cardiovascular: Negative for chest pain and palpitations.  Gastrointestinal: Positive for abdominal pain and vomiting.  Endocrine: Negative for polyphagia and polyuria.  Genitourinary: Negative for decreased urine volume, dysuria, enuresis, hematuria Musculoskeletal: Negative for  arthralgias and back pain.  Skin: Negative for color change and rash.  Allergic/Immunologic: Negative for immunocompromised state.  Neurological: Negative for tremors, syncope, speech difficulty, weakness, light-headedness and headaches.  Hematological: Does not bruise/bleed easily.  All other systems reviewed and are negative   Past Medical History:  Diagnosis Date  . Aneurysm (HCC)   . Hypertension    Past Surgical History:  Procedure Laterality Date  . SPLENECTOMY, TOTAL      Social History:  reports that she has never smoked. She has never used smokeless tobacco. She reports that she does not drink alcohol and does not use drugs.   No Known Allergies  Family History  Problem Relation Age of Onset  . Diabetes Mother   . Hypertension Mother   . Diabetes Father   . Hypertension Father   . Asthma Daughter      Prior to Admission medications   Medication Sig Start Date End Date Taking? Authorizing Provider  benzonatate (TESSALON) 200 MG capsule Take 1 capsule (200 mg total) by mouth 3 (three) times daily as needed for cough. Swallow whole, do not chew 02/11/20   Triplett, Tammy, PA-C  hydrochlorothiazide (MICROZIDE) 12.5 MG capsule TAKE 1 CAPSULE BY MOUTH ONCE A DAY FOR HIGH BLOOD PRESSURE. Patient not taking: Reported on 11/03/2017 10/29/15   Jacklyn Shell, CNM  phentermine 37.5 MG capsule Take 37.5 mg by mouth every morning.    [provider]  SPRINTEC 28 0.25-35 MG-MCG tablet TAKE ONE TABLET BY MOUTH DAILY. 11/16/18   Cresenzo-Dishmon, Scarlette Calico, CNM  TRI-LO-SPRINTEC 0.18/0.215/0.25 MG-25 MCG tab TAKE ONE TABLET BY MOUTH DAILY. 10/04/19   Jacklyn Shell, CNM    Physical Exam: BP (!) 146/87 (BP Location: Left Arm)  Pulse 88   Temp 99 F (37.2 C) (Oral)   Resp 16   Ht 5\' 2"  (1.575 m)   Wt 91.3 kg   LMP 02/13/2020 (Exact Date)   SpO2 100%   BMI 36.80 kg/m   . General: 39 y.o. year-old female well developed well nourished in no acute  distress.  Alert and oriented x3. 24 HEENT: NCAT, EOMI, PERRLA . Neck: Supple, trachea medial . Cardiovascular: Regular rate and rhythm with no rubs or gallops.  No thyromegaly or JVD noted.  No lower extremity edema. 2/4 pulses in all 4 extremities. Marland Kitchen Respiratory: Clear to auscultation with no wheezes or rales. Good inspiratory effort. . Abdomen: Soft tender to palpation without guarding.  Decreased bowel sounds in upper quadrants  . Muskuloskeletal: No cyanosis, clubbing or edema noted bilaterally . Neuro: CN II-XII intact, strength, sensation, reflexes . Skin: No ulcerative lesions noted or rashes . Psychiatry: Judgement and insight appear normal. Mood is appropriate for condition and setting          Labs on Admission:  Basic Metabolic Panel: Recent Labs  Lab 02/13/20 2226  NA 136  K 3.5  CL 100  CO2 22  GLUCOSE 110*  BUN 11  CREATININE 0.65  CALCIUM 9.0   Liver Function Tests: Recent Labs  Lab 02/13/20 2226  AST 23  ALT 12  ALKPHOS 64  BILITOT 0.3  PROT 8.2*  ALBUMIN 3.4*   Recent Labs  Lab 02/13/20 2226  LIPASE 19   No results for input(s): AMMONIA in the last 168 hours. CBC: Recent Labs  Lab 02/13/20 2226  WBC 8.2  HGB 10.0*  HCT 34.3*  MCV 81.5  PLT 310   Cardiac Enzymes: No results for input(s): CKTOTAL, CKMB, CKMBINDEX, TROPONINI in the last 168 hours.  BNP (last 3 results) No results for input(s): BNP in the last 8760 hours.  ProBNP (last 3 results) No results for input(s): PROBNP in the last 8760 hours.  CBG: No results for input(s): GLUCAP in the last 168 hours.  Radiological Exams on Admission: CT ABDOMEN PELVIS W CONTRAST  Result Date: 02/14/2020 CLINICAL DATA:  39 year old female with abdominal pain since 1900 hours, vomiting. EXAM: CT ABDOMEN AND PELVIS WITH CONTRAST TECHNIQUE: Multidetector CT imaging of the abdomen and pelvis was performed using the standard protocol following bolus administration of intravenous contrast.  CONTRAST:  24 OMNIPAQUE IOHEXOL 300 MG/ML  SOLN COMPARISON:  Chest radiographs 02/11/2020. FINDINGS: Lower chest: Multiple chronic left lateral rib deformities. Mild cardiomegaly. No pericardial effusion. Mild left lung scarring. Negative right lung base. Hepatobiliary: 1 or 2 small low-density areas in the liver (series 2, image 23) are nonspecific but likely benign. There appears to be a small area of benign early hepatic arterial enhancement at the liver dome also. Negative gallbladder. No bile duct enlargement. Pancreas: Negative. 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 Adrenals/Urinary Tract: No definite adrenal gland abnormality. Bilateral renal enhancement is symmetric without hydronephrosis. No nephrolithiasis is evident. Diminutive and unremarkable urinary bladder. Stomach/Bowel: Decompressed rectosigmoid colon, redundant sigmoid. Decompressed descending colon. Gas and stool at the splenic flexure, with relatively decompressed transverse colon elsewhere. Normal gas and stool in the right colon. Normal appendix identified on coronal image 38. Decompressed terminal ileum and multiple small bowel loops in the pelvis. But there is fluid-filled dilated small bowel in the abdomen, with loops up to 38 mm diameter. And there appears to be an abrupt transition point in the mid abdomen  on series 2, image 44 and coronal image 29. No associated swirling of the mesentery. However, there is also an abrupt upstream small bowel transition in the left mid abdomen on series 2, image 31 and coronal image 26. With nondilated proximal jejunum and duodenum. The stomach is also decompressed. No free air.  Questionable trace left upper quadrant free fluid. Vascular/Lymphatic: Major arterial structures, the central venous structures in the abdomen and pelvis, and portal venous system appear patent. Reproductive: Negative; physiologic left ovarian cysts suspected. Other: Small  volume of simple appearing pelvic free fluid. Musculoskeletal: T11 vertebra plana which appears chronic in addition to multiple left lateral rib deformities. No acute osseous abnormality identified. IMPRESSION: 1. Small Bowel Obstruction, with multiple dilated mid abdominal small bowel loops up to 38 mm diameter. And there are both upstream AND downstream abrupt transition points. Therefore, consider the possibility of a Closed Loop Obstruction. Recommend Surgery consultation. 2. Possible trace free fluid in the left upper quadrant, in addition to the pelvis. No free air. 3. 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 4. Mild cardiomegaly. Chronic left rib deformities, T11 vertebra plana. Electronically Signed   By: Odessa Fleming M.D.   On: 02/14/2020 02:31    EKG: I independently viewed the EKG done and my findings are as followed: EKG was not done in the ED  Assessment/Plan Present on Admission: . Small bowel obstruction (HCC)  Principal Problem:   Small bowel obstruction (HCC) Active Problems:   Abdominal pain   Abdominal pain in the setting of small bowel obstruction CT abdomen and pelvis showed small bowel obstruction Patient will be admitted to MedSurg Continue NG tube  Continue NPO at this time with plan to advance diet as tolerated Continue IV hydration Continue IV morphine 2 mg q.4h p.r.n. for moderate/severe pain Continue IV Zofran p.r.n. for nausea/vomiting General surgery was consulted and will see patient in the morning per ED physician   DVT prophylaxis: SCDs (no indication for chemoprophylaxis at this time due to possibility of surgical intervention in the morning)  Code Status: Full code  Family Communication: Husband at bedside (all questions answered to satisfaction)  Disposition Plan:  Patient is from:                        home Anticipated DC to:                   home Anticipated DC date:               2-3 days Anticipated DC  barriers:          Pending surgical evaluation   Consults called: General surgery per ED physician  Admission status: Inpatient    Frankey Shown MD [facility]  If 7PM-7AM, please contact night-coverage [email or web address]  02/14/2020, 5:44 AM

## 2020-02-14 NOTE — ED Provider Notes (Signed)
[facility] EMERGENCY DEPARTMENT Provider Note   CSN: [id number] Arrival date & time: 02/13/20  2134   History Chief Complaint  Patient presents with   Abdominal Pain    [name] is a 39 y.o. female.  The history is provided by the patient.  Abdominal Pain She has history of hypertension and comes in because of severe epigastric and periumbilical pain.  Pain started about 7 PM about 30 minutes after eating a taco.  There has been associated nausea and vomiting.  There is momentary improvement in pain following emesis.  She denies any diarrhea.  She denies fever or chills.  The pain does radiate to the back.  She rates pain at 10/10.  Nothing makes it better, nothing makes it worse.  Of note, she is status post splenectomy.  Past Medical History:  Diagnosis Date   Aneurysm [facility])    Hypertension     Patient Active Problem List   Diagnosis Date Noted   Sebaceous cyst of right axilla 11/03/2015   Hypertension     Past Surgical History:  Procedure Laterality Date   SPLENECTOMY, TOTAL       OB History   No obstetric history on file.     Family History  Problem Relation Age of Onset   Diabetes Mother    Hypertension Mother    Diabetes Father    Hypertension Father    Asthma Daughter     Social History   Tobacco Use   Smoking status: Never Smoker   Smokeless tobacco: Never Used  Building services engineer Use: Never used  Substance Use Topics   Alcohol use: No    Alcohol/week: 0.0 standard drinks   Drug use: No    Home Medications Prior to Admission medications   Medication Sig Start Date End Date Taking? Authorizing Provider  benzonatate (TESSALON) 200 MG capsule Take 1 capsule (200 mg total) by mouth 3 (three) times daily as needed for cough. Swallow whole, do not chew 02/11/20   Triplett, Tammy, PA-C  hydrochlorothiazide (MICROZIDE) 12.5 MG capsule TAKE 1 CAPSULE BY MOUTH ONCE A DAY FOR HIGH BLOOD PRESSURE. Patient not taking:  Reported on 11/03/2017 10/29/15   Jacklyn Shell, CNM  phentermine 37.5 MG capsule Take 37.5 mg by mouth every morning.    [provider]  SPRINTEC 28 0.25-35 MG-MCG tablet TAKE ONE TABLET BY MOUTH DAILY. 11/16/18   Cresenzo-Dishmon, Scarlette Calico, CNM  TRI-LO-SPRINTEC 0.18/0.215/0.25 MG-25 MCG tab TAKE ONE TABLET BY MOUTH DAILY. 10/04/19   Cresenzo-Dishmon, Scarlette Calico, CNM    Allergies    Patient has no known allergies.  Review of Systems   Review of Systems  Gastrointestinal: Positive for abdominal pain.  All other systems reviewed and are negative.   Physical Exam Updated Vital Signs BP 109/77 (BP Location: Right Arm)    Pulse 67    Temp 97.7 F (36.5 C) (Oral)    Resp 16    Ht 5\' 2"  (1.575 m)    Wt 89.8 kg    LMP 02/13/2020 (Exact Date)    SpO2 98%    BMI 36.21 kg/m   Physical Exam Vitals and nursing note reviewed.   39 year old female, in obvious pain, but in no acute distress. Vital signs are normal. Oxygen saturation is 98%, which is normal. Head is normocephalic and atraumatic. PERRLA, EOMI. Oropharynx is clear. Neck is nontender and supple without adenopathy or JVD. Back is nontender and there is no CVA tenderness. Lungs are clear without rales, wheezes,  or rhonchi. Chest is nontender. Heart has regular rate and rhythm without murmur. Abdomen is soft, flat, with marked tenderness across the upper abdomen.  Maximum tenderness appears to be in the epigastric area.  There is no rebound or guarding.  There are no masses or hepatomegaly and peristalsis is hypoactive. Extremities have no cyanosis or edema, full range of motion is present. Skin is warm and dry without rash. Neurologic: Mental status is normal, cranial nerves are intact, there are no motor or sensory deficits.  ED Results / Procedures / Treatments   Labs (all labs ordered are listed, but only abnormal results are displayed) Labs Reviewed  COMPREHENSIVE METABOLIC PANEL - Abnormal; Notable for the  following components:      Result Value   Glucose, Bld 110 (*)    Total Protein 8.2 (*)    Albumin 3.4 (*)    All other components within normal limits  CBC - Abnormal; Notable for the following components:   Hemoglobin 10.0 (*)    HCT 34.3 (*)    MCH 23.8 (*)    MCHC 29.2 (*)    RDW 16.9 (*)    All other components within normal limits  URINALYSIS, ROUTINE W REFLEX MICROSCOPIC - Abnormal; Notable for the following components:   Color, Urine AMBER (*)    APPearance HAZY (*)    Hgb urine dipstick LARGE (*)    Ketones, ur 20 (*)    Protein, ur 100 (*)    Bacteria, UA RARE (*)    All other components within normal limits  COMPREHENSIVE METABOLIC PANEL - Abnormal; Notable for the following components:   Sodium 134 (*)    Glucose, Bld 121 (*)    Calcium 8.7 (*)    Albumin 3.0 (*)    All other components within normal limits  CBC - Abnormal; Notable for the following components:   RBC 3.74 (*)    Hemoglobin 9.2 (*)    HCT 29.4 (*)    MCV 78.6 (*)    MCH 24.6 (*)    RDW 16.7 (*)    Platelets 498 (*)    All other components within normal limits  SARS CORONAVIRUS 2 BY RT PCR (HOSPITAL ORDER, PERFORMED IN [facility])  LIPASE, BLOOD  PROTIME-INR  APTT  MAGNESIUM  PHOSPHORUS  HIV ANTIBODY (ROUTINE TESTING W REFLEX)  POC URINE PREG, ED   Radiology CT ABDOMEN PELVIS W CONTRAST  Result Date: 02/14/2020 CLINICAL DATA:  39 year old female with abdominal pain since 1900 hours, vomiting. EXAM: CT ABDOMEN AND PELVIS WITH CONTRAST TECHNIQUE: Multidetector CT imaging of the abdomen and pelvis was performed using the standard protocol following bolus administration of intravenous contrast. CONTRAST:  OMNIPAQUE IOHEXOL 300 MG/ML  SOLN COMPARISON:  Chest radiographs 02/11/2020. FINDINGS: Lower chest: Multiple chronic left lateral rib deformities. Mild cardiomegaly. No pericardial effusion. Mild left lung scarring. Negative right lung base. Hepatobiliary: 1 or 2 small  low-density areas in the liver (series 2, image 23) are nonspecific but likely benign. There appears to be a small area of benign early hepatic arterial enhancement at the liver dome also. Negative gallbladder. No bile duct enlargement. Pancreas: Negative. 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 Adrenals/Urinary Tract: No definite adrenal gland abnormality. Bilateral renal enhancement is symmetric without hydronephrosis. No nephrolithiasis is evident. Diminutive and unremarkable urinary bladder. Stomach/Bowel: Decompressed rectosigmoid colon, redundant sigmoid. Decompressed descending colon. Gas and stool at the splenic flexure, with relatively decompressed transverse  colon elsewhere. Normal gas and stool in the right colon. Normal appendix identified on coronal image 38. Decompressed terminal ileum and multiple small bowel loops in the pelvis. But there is fluid-filled dilated small bowel in the abdomen, with loops up to 38 mm diameter. And there appears to be an abrupt transition point in the mid abdomen on series 2, image 44 and coronal image 29. No associated swirling of the mesentery. However, there is also an abrupt upstream small bowel transition in the left mid abdomen on series 2, image 31 and coronal image 26. With nondilated proximal jejunum and duodenum. The stomach is also decompressed. No free air.  Questionable trace left upper quadrant free fluid. Vascular/Lymphatic: Major arterial structures, the central venous structures in the abdomen and pelvis, and portal venous system appear patent. Reproductive: Negative; physiologic left ovarian cysts suspected. Other: Small volume of simple appearing pelvic free fluid. Musculoskeletal: T11 vertebra plana which appears chronic in addition to multiple left lateral rib deformities. No acute osseous abnormality identified. IMPRESSION: 1. Small Bowel Obstruction, with multiple dilated mid abdominal small bowel  loops up to 38 mm diameter. And there are both upstream AND downstream abrupt transition points. Therefore, consider the possibility of a Closed Loop Obstruction. Recommend Surgery consultation. 2. Possible trace free fluid in the left upper quadrant, in addition to the pelvis. No free air. 3. 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 4. Mild cardiomegaly. Chronic left rib deformities, T11 vertebra plana. Electronically Signed   By: Odessa Fleming M.D.   On: 02/14/2020 02:31    Procedures Procedures  Medications Ordered in ED Medications  sodium chloride flush (NS) 0.9 % injection 3 mL (has no administration in time range)    ED Course  I have reviewed the triage vital signs and the nursing notes.  Pertinent labs & imaging results that were available during my care of the patient were reviewed by me and considered in my medical decision making (see chart for details).  MDM Rules/Calculators/A&P Upper abdominal pain, etiology unclear.  Consider peptic ulcer disease, biliary colic, small bowel obstruction, diverticulitis, pancreatitis.  Labs are unremarkable.  She does have mild anemia which is unchanged from baseline.  Old records are reviewed, and it is noted that she was in the ED 2 days ago with a viral URI with cough and discharged on benzonatate.  Given severity of pain, will send for CT of abdomen and pelvis.  She will also be given IV fluids, ondansetron, morphine.  She got good symptomatic relief with above-noted treatment.  CT scan shows small bowel obstruction with concern for possible closed-loop obstruction.  Case is discussed with Dr. Lovell Sheehan of general surgery service who states he will see the patient in consultation but requests patient be admitted to the hospitalist service.  Case is discussed with Dr. Thomes Dinning [facility], who agrees to admit the patient.  Final Clinical Impression(s) / ED Diagnoses Final diagnoses:  Small bowel obstruction  (HCC)  Microcytic anemia  Thrombocytosis (HCC)    Rx / DC Orders ED Discharge Orders    None       Dione Booze, MD 02/14/20 [phone]
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
## 2020-02-15 LAB — CBC
HCT: 29 % — ABNORMAL LOW (ref 36.0–46.0)
Hemoglobin: 8.7 g/dL — ABNORMAL LOW (ref 12.0–15.0)
MCH: 24 pg — ABNORMAL LOW (ref 26.0–34.0)
MCHC: 30 g/dL (ref 30.0–36.0)
MCV: 80.1 fL (ref 80.0–100.0)
Platelets: 496 10*3/uL — ABNORMAL HIGH (ref 150–400)
RBC: 3.62 MIL/uL — ABNORMAL LOW (ref 3.87–5.11)
RDW: 16.8 % — ABNORMAL HIGH (ref 11.5–15.5)
WBC: 4.4 10*3/uL (ref 4.0–10.5)
nRBC: 0 % (ref 0.0–0.2)

## 2020-02-15 LAB — BASIC METABOLIC PANEL
Anion gap: 10 (ref 5–15)
BUN: 7 mg/dL (ref 6–20)
CO2: 23 mmol/L (ref 22–32)
Calcium: 8.3 mg/dL — ABNORMAL LOW (ref 8.9–10.3)
Chloride: 105 mmol/L (ref 98–111)
Creatinine, Ser: 0.58 mg/dL (ref 0.44–1.00)
GFR calc Af Amer: >60 mL/min (ref >60)
GFR calc non Af Amer: >60 mL/min (ref >60)
Glucose, Bld: 76 mg/dL (ref 70–99)
Potassium: 3.8 mmol/L (ref 3.5–5.1)
Sodium: 138 mmol/L (ref 135–145)

## 2020-02-15 LAB — PHOSPHORUS: Phosphorus: 3.3 mg/dL (ref 2.5–4.6)

## 2020-02-15 LAB — MAGNESIUM: Magnesium: 1.6 mg/dL — ABNORMAL LOW (ref 1.7–2.4)

## 2020-02-15 MED ORDER — ACETAMINOPHEN 325 MG PO TABS
650.0000 mg | ORAL_TABLET | ORAL | Status: DC | PRN
  Administered 2020-02-15 – 2020-02-18 (×7): 650 mg via ORAL
  Filled 2020-02-15 (×9): qty 2

## 2020-02-15 NOTE — Progress Notes (Signed)
Subjective: Patient states her abdominal pain has eased.  She still has intermittent crampy migratory abdominal pain.  She did have 2 bowel movements after Dulcolax suppository yesterday.  She denies any nausea.  Objective: Vital signs in last 24 hours: Temp:  [98.2 F (36.8 C)-98.6 F (37 C)] 98.4 F (36.9 C) (07/16 0507) Pulse Rate:  [59-78] 59 (07/16 0507) Resp:  [16-20] 18 (07/16 0507) BP: (131-137)/(76-96) 137/95 (07/16 0507) SpO2:  [100 %] 100 % (07/16 0507) Last BM Date: 02/14/20  Intake/Output from previous day: 07/15 0701 - 07/16 0700 In: 240 [P.O.:240] Out: -  Intake/Output this shift: No intake/output data recorded.  General appearance: alert, cooperative and no distress Resp: clear to auscultation bilaterally Cardio: regular rate and rhythm, S1, S2 normal, no murmur, click, rub or gallop GI: Soft with intermittent discomfort to palpation.  Bowel sounds are present.  Nondistended.  No rigidity is noted.  Lab Results:  Recent Labs    02/14/20 0537 02/15/20 0716  WBC 9.2 4.4  HGB 9.2* 8.7*  HCT 29.4* 29.0*  PLT 498* 496*   BMET Recent Labs    02/13/20 2226 02/14/20 0537  NA 136 134*  K 3.5 4.0  CL 100 102  CO2 22 24  GLUCOSE 110* 121*  BUN 11 11  CREATININE 0.65 0.59  CALCIUM 9.0 8.7*   PT/INR Recent Labs    02/14/20 0537  LABPROT 13.3  INR 1.1    Studies/Results: CT ABDOMEN PELVIS W CONTRAST  Result Date: 02/14/2020 CLINICAL DATA:  39 year old female with abdominal pain since 1900 hours, vomiting. EXAM: CT ABDOMEN AND PELVIS WITH CONTRAST TECHNIQUE: Multidetector CT imaging of the abdomen and pelvis was performed using the standard protocol following bolus administration of intravenous contrast. CONTRAST:  OMNIPAQUE IOHEXOL 300 MG/ML  SOLN COMPARISON:  Chest radiographs 02/11/2020. FINDINGS: Lower chest: Multiple chronic left lateral rib deformities. Mild cardiomegaly. No pericardial effusion. Mild left lung scarring. Negative right  lung base. Hepatobiliary: 1 or 2 small low-density areas in the liver (series 2, image 23) are nonspecific but likely benign. There appears to be a small area of benign early hepatic arterial enhancement at the liver dome also. Negative gallbladder. No bile duct enlargement. Pancreas: Negative. 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 Adrenals/Urinary Tract: No definite adrenal gland abnormality. Bilateral renal enhancement is symmetric without hydronephrosis. No nephrolithiasis is evident. Diminutive and unremarkable urinary bladder. Stomach/Bowel: Decompressed rectosigmoid colon, redundant sigmoid. Decompressed descending colon. Gas and stool at the splenic flexure, with relatively decompressed transverse colon elsewhere. Normal gas and stool in the right colon. Normal appendix identified on coronal image 38. Decompressed terminal ileum and multiple small bowel loops in the pelvis. But there is fluid-filled dilated small bowel in the abdomen, with loops up to 38 mm diameter. And there appears to be an abrupt transition point in the mid abdomen on series 2, image 44 and coronal image 29. No associated swirling of the mesentery. However, there is also an abrupt upstream small bowel transition in the left mid abdomen on series 2, image 31 and coronal image 26. With nondilated proximal jejunum and duodenum. The stomach is also decompressed. No free air.  Questionable trace left upper quadrant free fluid. Vascular/Lymphatic: Major arterial structures, the central venous structures in the abdomen and pelvis, and portal venous system appear patent. Reproductive: Negative; physiologic left ovarian cysts suspected. Other: Small volume of simple appearing pelvic free fluid. Musculoskeletal: T11 vertebra plana which appears chronic in addition  to multiple left lateral rib deformities. No acute osseous abnormality identified. IMPRESSION: 1. Small Bowel Obstruction, with  multiple dilated mid abdominal small bowel loops up to 38 mm diameter. And there are both upstream AND downstream abrupt transition points. Therefore, consider the possibility of a Closed Loop Obstruction. Recommend Surgery consultation. 2. Possible trace free fluid in the left upper quadrant, in addition to the pelvis. No free air. 3. 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 4. Mild cardiomegaly. Chronic left rib deformities, T11 vertebra plana. Electronically Signed   By: Odessa Fleming M.D.   On: 02/14/2020 02:31    Anti-infectives: Anti-infectives (From admission, onward)   None      Assessment/Plan: Impression: Small bowel obstruction, partial, resolving. Plan: No need for acute surgical intervention at this time.  Will advance to clear liquid diet.  LOS: 1 day    Franky Macho 02/15/2020

## 2020-02-16 MED ORDER — POLYETHYLENE GLYCOL 3350 17 G PO PACK
17.0000 g | PACK | Freq: Every day | ORAL | Status: DC
  Administered 2020-02-17 – 2020-02-18 (×2): 17 g via ORAL
  Filled 2020-02-16 (×3): qty 1

## 2020-02-16 NOTE — Progress Notes (Signed)
°  Subjective: Patient has minimal abdominal pain.  Some cramping noted.  Is passing gas.  Objective: Vital signs in last 24 hours: Temp:  [98.7 F (37.1 C)-98.8 F (37.1 C)] 98.8 F (37.1 C) (07/17 0526) Pulse Rate:  [68-78] 78 (07/17 0526) Resp:  [17-20] 20 (07/17 0526) BP: (143-147)/(90-103) 143/90 (07/17 0526) SpO2:  [100 %] 100 % (07/17 0526) Last BM Date: 02/14/20  Intake/Output from previous day: 07/16 0701 - 07/17 0700 In: 720 [P.O.:720] Out: -  Intake/Output this shift: No intake/output data recorded.  General appearance: alert, cooperative and no distress Resp: clear to auscultation bilaterally Cardio: regular rate and rhythm, S1, S2 normal, no murmur, click, rub or gallop GI: No rigidity is noted.  Soft, minimal tenderness.  Bowel sounds present.  Lab Results:  Recent Labs    02/14/20 0537 02/15/20 0716  WBC 9.2 4.4  HGB 9.2* 8.7*  HCT 29.4* 29.0*  PLT 498* 496*   BMET Recent Labs    02/14/20 0537 02/15/20 0716  NA 134* 138  K 4.0 3.8  CL 102 105  CO2 24 23  GLUCOSE 121* 76  BUN 11 7  CREATININE 0.59 0.58  CALCIUM 8.7* 8.3*   PT/INR Recent Labs    02/14/20 0537  LABPROT 13.3  INR 1.1    Studies/Results: No results found.  Anti-infectives: Anti-infectives (From admission, onward)   None      Assessment/Plan: Impression: Partial small bowel obstruction, resolving Plan: We will advance to full liquid diet.  Encourage ambulation.  No need for surgical intervention at this time.  Anticipate discharge in next 24 to 48 hours.  Will add MiraLAX.  LOS: 2 days    [name] 02/16/2020

## 2020-02-17 ENCOUNTER — Inpatient Hospital Stay (HOSPITAL_COMMUNITY): Payer: 59

## 2020-02-17 ENCOUNTER — Encounter (HOSPITAL_COMMUNITY): Payer: Self-pay | Admitting: Internal Medicine

## 2020-02-17 MED ORDER — IOHEXOL 300 MG/ML  SOLN
100.0000 mL | Freq: Once | INTRAMUSCULAR | Status: AC | PRN
  Administered 2020-02-17: 100 mL via INTRAVENOUS

## 2020-02-17 MED ORDER — IOHEXOL 9 MG/ML PO SOLN
ORAL | Status: AC
  Filled 2020-02-17: qty 1000

## 2020-02-17 MED ORDER — PSEUDOEPHEDRINE HCL ER 120 MG PO TB12
120.0000 mg | ORAL_TABLET | Freq: Two times a day (BID) | ORAL | Status: DC
  Administered 2020-02-17 – 2020-02-18 (×3): 120 mg via ORAL
  Filled 2020-02-17 (×7): qty 1

## 2020-02-17 NOTE — Progress Notes (Signed)
Patient ambulated in hallway with family over 200 feet. Patient tolerated walk well. No assistance needed for ambulation.

## 2020-02-17 NOTE — Progress Notes (Signed)
  Subjective: Patient has a worsening left parietal headache.  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  She is passing gas and has minimal lower abdominal pain, having a bowel movement this morning after MiraLAX.  No nausea is noted.  Objective: Vital signs in last 24 hours: Temp:  [98.2 F (36.8 C)] 98.2 F (36.8 C) (07/18 0639) Pulse Rate:  [80-82] 80 (07/18 0639) Resp:  [20] 20 (07/18 0639) BP: (132-136)/(90-95) 136/95 (07/18 0639) SpO2:  [99 %-100 %] 99 % (07/18 0639) Last BM Date: 02/16/20  Intake/Output from previous day: 07/17 0701 - 07/18 0700 In: 1080 [P.O.:1080] Out: -  Intake/Output this shift: Total I/O In: 360 [P.O.:360] Out: -   General appearance: alert, cooperative and no distress Resp: clear to auscultation bilaterally Cardio: regular rate and rhythm, S1, S2 normal, no murmur, click, rub or gallop GI: Soft with minimal tenderness which is migratory in nature.  No rigidity is noted.  No significant distention is noted. Neuro: Grossly no neurologic deficits noted.  Patient walking around in the room without difficulty. Lab Results:  Recent Labs    02/15/20 0716  WBC 4.4  HGB 8.7*  HCT 29.0*  PLT 496*   BMET Recent Labs    02/15/20 0716  NA 138  K 3.8  CL 105  CO2 23  GLUCOSE 76  BUN 7  CREATININE 0.58  CALCIUM 8.3*   PT/INR No results for input(s): LABPROT, INR in the last 72 hours.  Studies/Results: No results found.  Anti-infectives: Anti-infectives (From admission, onward)   None      Assessment/Plan: Impression: Headache of unknown etiology, history of aneurysmal bleed Resolving partial small bowel obstruction Plan: We will get CT scan of head abdomen and pelvis today.  Further management is pending those results.   LOS: 3 days    Franky Macho 02/17/2020

## 2020-02-18 ENCOUNTER — Encounter: Payer: Self-pay | Admitting: Family Medicine

## 2020-02-18 NOTE — Plan of Care (Signed)

## 2020-02-18 NOTE — Discharge Instructions (Signed)
Milk of magnesia 1 -2 tablespoons every 6 hours as needed for constipation   Bowel Obstruction A bowel obstruction is a blockage in the small or large bowel. The bowel, which is also called the intestine, is a long, slender tube that connects the stomach to the anus. When a person eats and drinks, food and fluids go from the mouth to the stomach to the small bowel. This is where most of the nutrients in the food and fluids are absorbed. After the small bowel, material passes through the large bowel for further absorption until any leftover material leaves the body as stool through the anus during a bowel movement. A bowel obstruction will prevent food and fluids from passing through the bowel as they normally do during digestion. The bowel can become partially or completely blocked. If this condition is not treated, it can be dangerous because the bowel could rupture. What are the causes? Common causes of this condition include:  Scar tissue (adhesions) from previous surgery or treatment with high-energy X-rays (radiation).  Recent surgery. This may cause the movements of the bowel to slow down and cause food to block the intestine.  Inflammatory bowel disease, such as Crohn's disease or diverticulitis.  Growths or tumors.  A bulging organ (hernia).  Twisting of the bowel (volvulus).  A foreign body.  Slipping of a part of the bowel into another part (intussusception). What are the signs or symptoms? Symptoms of this condition include:  Pain in the abdomen. Depending on the degree of obstruction, pain may be: ? Mild or severe. ? Dull cramping or sharp pain. ? In one area or in the entire abdomen.  Nausea and vomiting. Vomit may be greenish or a yellow bile color.  Bloating in the abdomen.  Difficulty passing stool (constipation).  Lack of passing gas.  Frequent belching.  Diarrhea. This may occur if the obstruction is partial and runny stool is able to leak around the  obstruction. How is this diagnosed? This condition may be diagnosed based on:  A physical exam.  Medical history.  Imaging tests of the abdomen or pelvis, such as X-ray or CT scan.  Blood or urine tests. How is this treated? Treatment for this condition depends on the cause and severity of the problem. Treatment may include:  Fluids and pain medicines that are given through an IV. Your health care provider may instruct you not to eat or drink if you have nausea or vomiting.  Eating a simple diet. You may be asked to consume a clear liquid diet for several days. This allows the bowel to rest.  Placement of a small tube (nasogastric tube) into the stomach. This will relieve pain, discomfort, and nausea by removing blocked air and fluids from the stomach. It can also help the obstruction clear up faster.  Surgery. This may be required if other treatments do not work. Surgery may be required for: ? Bowel obstruction from a hernia. This can be an emergency procedure. ? Scar tissue that causes frequent or severe obstructions. Follow these instructions at home: Medicines  Take over-the-counter and prescription medicines only as told by your health care provider.  If you were prescribed an antibiotic medicine, take it as told by your health care provider. Do not stop taking the antibiotic even if you start to feel better. General instructions  Follow instructions from your health care provider about eating restrictions. You may need to avoid solid foods and consume only clear liquids until your condition improves.  Return  to your normal activities as told by your health care provider. Ask your health care provider what activities are safe for you.  Avoid sitting for a long time without moving. Get up to take short walks every 1-2 hours. This is important to improve blood flow and breathing. Ask for help if you feel weak or unsteady.  Keep all follow-up visits as told by your health care  provider. This is important. How is this prevented? After having a bowel obstruction, you are more likely to have another. You may do the following things to prevent another obstruction:  If you have a long-term (chronic) disease, pay attention to your symptoms and contact your health care provider if you have questions or concerns.  Avoid becoming constipated. To prevent or treat constipation, your health care provider may recommend that you: ? Drink enough fluid to keep your urine pale yellow. ? Take over-the-counter or prescription medicines. ? Eat foods that are high in fiber, such as beans, whole grains, and fresh fruits and vegetables. ? Limit foods that are high in fat and processed sugars, such as fried or sweet foods.  Stay active. Exercise for 30 minutes or more, 5 or more days each week. Ask your health care provider which exercises are safe for you.  Avoid stress. Find ways to reduce stress, such as meditation, exercise, or taking time for activities that relax you.  Instead of eating three large meals each day, eat three small meals with three small snacks.  Work with a Microbiologist to make a healthy meal plan that works for you.  Do not use any products that contain nicotine or tobacco, such as cigarettes and e-cigarettes. If you need help quitting, ask your health care provider. Contact a health care provider if you:  Have a fever.  Have chills. Get help right away if you:  Have increased pain or cramping.  Vomit blood.  Have uncontrolled vomiting or nausea.  Cannot drink fluids because of vomiting or pain.  Become confused.  Begin feeling very thirsty (dehydrated).  Have severe bloating.  Feel extremely weak or you faint. Summary  A bowel obstruction is a blockage in the small or large bowel.  A bowel obstruction will prevent food and fluids from passing through the bowel as they normally do during digestion.  Treatment for this condition depends on the  cause and severity of the problem. It may include fluids and pain medicines through an IV, a simple diet, a nasogastric tube, or surgery.  Follow instructions from your health care provider about eating restrictions. You may need to avoid solid foods and consume only clear liquids until your condition improves. This information is not intended to replace advice given to you by your health care provider. Make sure you discuss any questions you have with your health care provider. Document Revised: 08/25/2018 Document Reviewed: 11/30/2017 Elsevier Patient Education  [place].

## 2020-02-18 NOTE — Discharge Summary (Signed)
Physician Discharge Summary  Patient ID: [name] MRN: [id number] DOB/AGE: 11/16/1980 39 y.o.  Admit date: 02/13/2020 Discharge date: 02/18/2020  Admission Diagnoses: Small bowel obstruction  Discharge Diagnoses: Same Principal Problem:   Small bowel obstruction (HCC) Active Problems:   Abdominal pain   Discharged Condition: good  Hospital Course: Patient is a 39 year old black female who presented to the emergency room with worsening nausea and vomiting.  CT scan of the abdomen revealed a small bowel obstruction.  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  She was admitted to the hospital for conservative management.  She also developed a headache during her hospitalization.  She has had an aneurysmal bleed at the end of the pregnancy in the past.  She had no neurologic deficits.  CT scan of the head, abdomen and pelvis on 02/17/2020 were unremarkable.  Her diet was advanced out difficulty.  The patient is being discharged home on 02/18/2020 in good and improving condition.   Discharge Exam: Blood pressure 128/87, pulse 82, temperature 98.4 F (36.9 C), temperature source Oral, resp. rate 20, height 5\' 2"  (1.575 m), weight 91.3 kg, last menstrual period 02/13/2020, SpO2 100 %. General appearance: alert, cooperative and no distress Resp: clear to auscultation bilaterally Cardio: regular rate and rhythm, S1, S2 normal, no murmur, click, rub or gallop GI: soft, non-tender; bowel sounds normal; no masses,  no organomegaly  Disposition: Discharge disposition: 01-Home or Self Care       Discharge Instructions    Diet - low sodium heart healthy   Complete by: As directed    Increase activity slowly   Complete by: As directed      Allergies as of 02/18/2020   No Known Allergies     Medication List    TAKE these medications   benzonatate 200 MG capsule Commonly known as: TESSALON Take 1 capsule (200 mg  total) by mouth 3 (three) times daily as needed for cough. Swallow whole, do not chew   multivitamin with minerals Tabs tablet Take 1 tablet by mouth daily.   phenylephrine 10 MG Tabs tablet Commonly known as: SUDAFED PE Take 10 mg by mouth every 4 (four) hours as needed.   Tri-Lo-Sprintec 0.18/0.215/0.25 MG-25 MCG tab Generic drug: Norgestimate-Ethinyl Estradiol Triphasic TAKE ONE TABLET BY MOUTH DAILY.       Follow-up Information    08-30-1981, MD Follow up.   Specialty: General Surgery Why: As needed Contact information: [address] [place] [phone]               Signed: [phone] 02/18/2020, 7:04 AM

## 2020-02-18 NOTE — Plan of Care (Signed)
  Problem: Education: Goal: Knowledge of General Education information will improve Description: Including pain rating scale, medication(s)/side effects and non-pharmacologic comfort measures 02/18/2020 0831 by Carole Civil, RN Outcome: Adequate for Discharge 02/18/2020 0831 by Carole Civil, RN Outcome: Adequate for Discharge   Problem: Health Behavior/Discharge Planning: Goal: Ability to manage health-related needs will improve 02/18/2020 0831 by Carole Civil, RN Outcome: Adequate for Discharge 02/18/2020 0831 by Carole Civil, RN Outcome: Adequate for Discharge   Problem: Clinical Measurements: Goal: Ability to maintain clinical measurements within normal limits will improve 02/18/2020 0831 by Carole Civil, RN Outcome: Adequate for Discharge 02/18/2020 0831 by Carole Civil, RN Outcome: Adequate for Discharge Goal: Will remain free from infection 02/18/2020 0831 by Carole Civil, RN Outcome: Adequate for Discharge 02/18/2020 0831 by Carole Civil, RN Outcome: Adequate for Discharge Goal: Diagnostic test results will improve 02/18/2020 0831 by Carole Civil, RN Outcome: Adequate for Discharge 02/18/2020 0831 by Carole Civil, RN Outcome: Adequate for Discharge Goal: Respiratory complications will improve 02/18/2020 0831 by Carole Civil, RN Outcome: Adequate for Discharge 02/18/2020 0831 by Carole Civil, RN Outcome: Adequate for Discharge Goal: Cardiovascular complication will be avoided 02/18/2020 0831 by Carole Civil, RN Outcome: Adequate for Discharge 02/18/2020 0831 by Carole Civil, RN Outcome: Adequate for Discharge   Problem: Activity: Goal: Risk for activity intolerance will decrease 02/18/2020 0831 by Carole Civil, RN Outcome: Adequate for Discharge 02/18/2020 0831 by Carole Civil, RN Outcome: Adequate for Discharge   Problem: Nutrition: Goal: Adequate nutrition will be maintained 02/18/2020 0831 by Carole Civil, RN Outcome: Adequate for  Discharge 02/18/2020 0831 by Carole Civil, RN Outcome: Adequate for Discharge   Problem: Coping: Goal: Level of anxiety will decrease 02/18/2020 0831 by Carole Civil, RN Outcome: Adequate for Discharge 02/18/2020 0831 by Carole Civil, RN Outcome: Adequate for Discharge   Problem: Elimination: Goal: Will not experience complications related to bowel motility 02/18/2020 0831 by Carole Civil, RN Outcome: Adequate for Discharge 02/18/2020 0831 by Carole Civil, RN Outcome: Adequate for Discharge Goal: Will not experience complications related to urinary retention 02/18/2020 0831 by Carole Civil, RN Outcome: Adequate for Discharge 02/18/2020 0831 by Carole Civil, RN Outcome: Adequate for Discharge   Problem: Pain Managment: Goal: General experience of comfort will improve 02/18/2020 0831 by Carole Civil, RN Outcome: Adequate for Discharge 02/18/2020 0831 by Carole Civil, RN Outcome: Adequate for Discharge   Problem: Safety: Goal: Ability to remain free from injury will improve 02/18/2020 0831 by Carole Civil, RN Outcome: Adequate for Discharge 02/18/2020 0831 by Carole Civil, RN Outcome: Adequate for Discharge   Problem: Skin Integrity: Goal: Risk for impaired skin integrity will decrease 02/18/2020 0831 by Carole Civil, RN Outcome: Adequate for Discharge 02/18/2020 0831 by Carole Civil, RN Outcome: Adequate for Discharge

## 2020-04-09 ENCOUNTER — Other Ambulatory Visit: Payer: Self-pay | Admitting: Advanced Practice Midwife

## 2020-05-07 ENCOUNTER — Encounter: Payer: Self-pay | Admitting: *Deleted

## 2020-05-07 ENCOUNTER — Telehealth: Payer: Self-pay | Admitting: Women's Health

## 2020-05-07 NOTE — Telephone Encounter (Signed)
Pt didn't pick up so I sent a My Chart to pt. Closing this encounter. JSY

## 2020-05-07 NOTE — Telephone Encounter (Signed)
Patient called stating that she would like a letter to send to her insurance stating that she takes her birth control for only to prevent her from having children. Pt states that she dopes not want to pay $30 dollar co pay for the medication. Please contact pt when note is ready for pick up.

## 2020-05-08 NOTE — Telephone Encounter (Signed)
Note written for birth control to be covered @ 100% for insurance company. Pt advised can pick up original and copy sent to MyChart. Pt aware. JSY

## 2020-07-19 ENCOUNTER — Other Ambulatory Visit: Payer: 59

## 2020-07-19 DIAGNOSIS — Z20822 Contact with and (suspected) exposure to covid-19: Secondary | ICD-10-CM

## 2020-07-21 ENCOUNTER — Emergency Department (HOSPITAL_COMMUNITY): Payer: 59

## 2020-07-21 ENCOUNTER — Encounter (HOSPITAL_COMMUNITY): Payer: Self-pay | Admitting: *Deleted

## 2020-07-21 ENCOUNTER — Emergency Department (HOSPITAL_COMMUNITY)
Admission: EM | Admit: 2020-07-21 | Discharge: 2020-07-21 | Disposition: A | Discharge status: Home / Self Care | Payer: 59 | Attending: Emergency Medicine | Admitting: Emergency Medicine

## 2020-07-21 ENCOUNTER — Other Ambulatory Visit: Payer: Self-pay

## 2020-07-21 DIAGNOSIS — U071 COVID-19: Secondary | ICD-10-CM | POA: Diagnosis not present

## 2020-07-21 DIAGNOSIS — I10 Essential (primary) hypertension: Secondary | ICD-10-CM | POA: Diagnosis not present

## 2020-07-21 DIAGNOSIS — R55 Syncope and collapse: Secondary | ICD-10-CM | POA: Insufficient documentation

## 2020-07-21 DIAGNOSIS — R509 Fever, unspecified: Secondary | ICD-10-CM | POA: Diagnosis present

## 2020-07-21 DIAGNOSIS — E860 Dehydration: Secondary | ICD-10-CM | POA: Insufficient documentation

## 2020-07-21 LAB — RESP PANEL BY RT-PCR (FLU A&B, COVID) ARPGX2
Influenza A by PCR: NEGATIVE
Influenza B by PCR: NEGATIVE
SARS Coronavirus 2 by RT PCR: POSITIVE — AB

## 2020-07-21 LAB — URINALYSIS, ROUTINE W REFLEX MICROSCOPIC
Bilirubin Urine: NEGATIVE
Glucose, UA: NEGATIVE mg/dL
Ketones, ur: 80 mg/dL — AB
Leukocytes,Ua: NEGATIVE
Nitrite: NEGATIVE
Protein, ur: >=300 mg/dL — AB
Specific Gravity, Urine: 1.026 (ref 1.005–1.030)
pH: 5 (ref 5.0–8.0)

## 2020-07-21 LAB — COMPREHENSIVE METABOLIC PANEL
ALT: 14 U/L (ref 0–44)
AST: 24 U/L (ref 15–41)
Albumin: 3.5 g/dL (ref 3.5–5.0)
Alkaline Phosphatase: 59 U/L (ref 38–126)
Anion gap: 9 (ref 5–15)
BUN: 9 mg/dL (ref 6–20)
CO2: 24 mmol/L (ref 22–32)
Calcium: 8.9 mg/dL (ref 8.9–10.3)
Chloride: 97 mmol/L — ABNORMAL LOW (ref 98–111)
Creatinine, Ser: 0.73 mg/dL (ref 0.44–1.00)
GFR, Estimated: >60 mL/min (ref >60)
Glucose, Bld: 92 mg/dL (ref 70–99)
Potassium: 4.1 mmol/L (ref 3.5–5.1)
Sodium: 130 mmol/L — ABNORMAL LOW (ref 135–145)
Total Bilirubin: 0.3 mg/dL (ref 0.3–1.2)
Total Protein: 8.3 g/dL — ABNORMAL HIGH (ref 6.5–8.1)

## 2020-07-21 LAB — CBC WITH DIFFERENTIAL/PLATELET
Abs Immature Granulocytes: 0.02 10*3/uL (ref 0.00–0.07)
Basophils Absolute: 0 10*3/uL (ref 0.0–0.1)
Basophils Relative: 1 %
Eosinophils Absolute: 0 10*3/uL (ref 0.0–0.5)
Eosinophils Relative: 0 %
HCT: 34.4 % — ABNORMAL LOW (ref 36.0–46.0)
Hemoglobin: 10.2 g/dL — ABNORMAL LOW (ref 12.0–15.0)
Immature Granulocytes: 0 %
Lymphocytes Relative: 9 %
Lymphs Abs: 0.4 10*3/uL — ABNORMAL LOW (ref 0.7–4.0)
MCH: 22.8 pg — ABNORMAL LOW (ref 26.0–34.0)
MCHC: 29.7 g/dL — ABNORMAL LOW (ref 30.0–36.0)
MCV: 76.8 fL — ABNORMAL LOW (ref 80.0–100.0)
Monocytes Absolute: 0.4 10*3/uL (ref 0.1–1.0)
Monocytes Relative: 9 %
Neutro Abs: 3.8 10*3/uL (ref 1.7–7.7)
Neutrophils Relative %: 81 %
Platelets: 350 10*3/uL (ref 150–400)
RBC: 4.48 MIL/uL (ref 3.87–5.11)
RDW: 18.4 % — ABNORMAL HIGH (ref 11.5–15.5)
WBC: 4.8 10*3/uL (ref 4.0–10.5)
nRBC: 0 % (ref 0.0–0.2)

## 2020-07-21 MED ORDER — ONDANSETRON 4 MG PO TBDP
4.0000 mg | ORAL_TABLET | Freq: Three times a day (TID) | ORAL | 0 refills | Status: DC | PRN
Start: 2020-07-21 — End: 2020-09-22

## 2020-07-21 MED ORDER — SODIUM CHLORIDE 0.9 % IV BOLUS
1000.0000 mL | Freq: Once | INTRAVENOUS | Status: AC
  Administered 2020-07-21: 1000 mL via INTRAVENOUS

## 2020-07-21 MED ORDER — ONDANSETRON HCL 4 MG/2ML IJ SOLN
4.0000 mg | Freq: Once | INTRAMUSCULAR | Status: AC
  Administered 2020-07-21: 4 mg via INTRAVENOUS
  Filled 2020-07-21: qty 2

## 2020-07-21 MED ORDER — BENZONATATE 100 MG PO CAPS
100.0000 mg | ORAL_CAPSULE | Freq: Three times a day (TID) | ORAL | 0 refills | Status: DC
Start: 2020-07-21 — End: 2020-08-19

## 2020-07-21 NOTE — ED Triage Notes (Signed)
Syncopal episode x 2 prior to arrival

## 2020-07-21 NOTE — ED Provider Notes (Signed)
[facility] EMERGENCY DEPARTMENT Provider Note   CSN: [id number] Arrival date & time: 07/21/20  1405     History Chief Complaint  Patient presents with  . Loss of Consciousness    [name] is a 39 y.o. female with a hx of hypertension who presents to the ED with complaints of syncope x 2 today. Patient states she has been feeling poorly for the past 3-4 days with fever, chills, body aches, congestion, dry cough, nausea, 1 episode of emesis, & 1 episode of diarrhea. She has had generally poor PO intake & has been feeling very weak. Today when she sat up on the couch she became very lightheaded & passed out, this happened a 2nd time with same mechanism when she again attempted to sit up. No other alleviating/aggravating factors. Her husband has been sick w/ similar sxs however he is now feeling better. She denies dyspnea, chest pain, palpitations, abdominal pain, headache, numbness, or focal weakness. She has not received her COVID 19 vaccine. Denies leg pain/swelling, hemoptysis, recent surgery/trauma, recent long travel, personal hx of cancer, or hx of DVT/PE.   HPI     Past Medical History:  Diagnosis Date  . Aneurysm (HCC)   . Hypertension     Patient Active Problem List   Diagnosis Date Noted  . Small bowel obstruction (HCC) 02/14/2020  . Abdominal pain 02/14/2020  . Sebaceous cyst of right axilla 11/03/2015  . Hypertension     Past Surgical History:  Procedure Laterality Date  . SPLENECTOMY, TOTAL       OB History   No obstetric history on file.     Family History  Problem Relation Age of Onset  . Diabetes Mother   . Hypertension Mother   . Diabetes Father   . Hypertension Father   . Asthma Daughter     Social History   Tobacco Use  . Smoking status: Never Smoker  . Smokeless tobacco: Never Used  Vaping Use  . Vaping Use: Never used  Substance Use Topics  . Alcohol use: No    Alcohol/week: 0.0 standard drinks  . Drug use: No     Home Medications Prior to Admission medications   Medication Sig Start Date End Date Taking? Authorizing Provider  benzonatate (TESSALON) 200 MG capsule Take 1 capsule (200 mg total) by mouth 3 (three) times daily as needed for cough. Swallow whole, do not chew 02/11/20   Triplett, Tammy, PA-C  Multiple Vitamin (MULTIVITAMIN WITH MINERALS) TABS tablet Take 1 tablet by mouth daily.    [provider]  Norgestimate-Ethinyl Estradiol Triphasic (TRI-LO-SPRINTEC) 0.18/0.215/0.25 MG-25 MCG tab Take 1 tablet by mouth daily. 04/10/20   Cresenzo-Dishmon, Scarlette Calico, CNM  phenylephrine (SUDAFED PE) 10 MG TABS tablet Take 10 mg by mouth every 4 (four) hours as needed.    [provider]    Allergies    Patient has no known allergies.  Review of Systems   Review of Systems  Constitutional: Positive for chills, fatigue and fever.  HENT: Positive for congestion. Negative for ear pain and sore throat.   Eyes: Negative for visual disturbance.  Respiratory: Positive for cough. Negative for shortness of breath.   Cardiovascular: Negative for chest pain and leg swelling.  Gastrointestinal: Positive for diarrhea, nausea and vomiting. Negative for abdominal pain and blood in stool.  Genitourinary: Negative for dysuria.  Musculoskeletal: Positive for myalgias (generalized).  Skin: Negative for rash.  Neurological: Positive for syncope and light-headedness. Negative for seizures, speech difficulty, weakness (focal), numbness and  headaches.  All other systems reviewed and are negative.   Physical Exam Updated Vital Signs BP 121/84   Pulse 64   Temp 97.7 F (36.5 C)   Resp 18   Ht 5\' 3"  (1.6 m)   Wt 92 kg   LMP 06/30/2020   SpO2 100%   BMI 35.93 kg/m   Physical Exam Vitals and nursing note reviewed.  Constitutional:      General: She is not in acute distress.    Appearance: She is well-developed.  HENT:     Head: Normocephalic and atraumatic.     Right Ear: Ear canal normal.  Tympanic membrane is not perforated, erythematous, retracted or bulging.     Left Ear: Ear canal normal. Tympanic membrane is not perforated, erythematous, retracted or bulging.     Ears:     Comments: No mastoid erythema/swelling/tenderness.     Nose:     Right Sinus: No maxillary sinus tenderness or frontal sinus tenderness.     Left Sinus: No maxillary sinus tenderness or frontal sinus tenderness.     Mouth/Throat:     Mouth: Mucous membranes are dry.     Pharynx: Uvula midline. No oropharyngeal exudate or posterior oropharyngeal erythema.     Comments: Posterior oropharynx is symmetric appearing. Patient tolerating own secretions without difficulty. No trismus. No drooling. No hot potato voice. No swelling beneath the tongue, submandibular compartment is soft.  Eyes:     General:        Right eye: No discharge.        Left eye: No discharge.     Extraocular Movements: Extraocular movements intact.     Conjunctiva/sclera: Conjunctivae normal.     Pupils: Pupils are equal, round, and reactive to light.  Cardiovascular:     Rate and Rhythm: Normal rate and regular rhythm.     Heart sounds: No murmur heard.   Pulmonary:     Effort: Pulmonary effort is normal. No respiratory distress.     Breath sounds: Normal breath sounds. No wheezing, rhonchi or rales.  Abdominal:     General: There is no distension.     Palpations: Abdomen is soft.     Tenderness: There is no abdominal tenderness. There is no guarding or rebound.  Musculoskeletal:        General: No tenderness (to bilateral LEs. ).     Cervical back: Normal range of motion and neck supple. No edema or rigidity.     Right lower leg: No edema.     Left lower leg: No edema.  Lymphadenopathy:     Cervical: No cervical adenopathy.  Skin:    General: Skin is warm and dry.     Findings: No rash.  Neurological:     Mental Status: She is alert.     Comments: Alert.  Clear speech.  CN III through XII grossly intact sensation  grossly intact bilateral upper and lower extremities.  5 out of 5 symmetric grip strength.  5 out of 5 strength with plantar dorsiflexion bilaterally.  Psychiatric:        Behavior: Behavior normal.     ED Results / Procedures / Treatments   Labs (all labs ordered are listed, but only abnormal results are displayed) Labs Reviewed  RESP PANEL BY RT-PCR (FLU A&B, COVID) ARPGX2 - Abnormal; Notable for the following components:      Result Value   SARS Coronavirus 2 by RT PCR POSITIVE (*)    All other components within normal limits  COMPREHENSIVE METABOLIC PANEL - Abnormal; Notable for the following components:   Sodium 130 (*)    Chloride 97 (*)    Total Protein 8.3 (*)    All other components within normal limits  CBC WITH DIFFERENTIAL/PLATELET - Abnormal; Notable for the following components:   Hemoglobin 10.2 (*)    HCT 34.4 (*)    MCV 76.8 (*)    MCH 22.8 (*)    MCHC 29.7 (*)    RDW 18.4 (*)    Lymphs Abs 0.4 (*)    All other components within normal limits  URINALYSIS, ROUTINE W REFLEX MICROSCOPIC - Abnormal; Notable for the following components:   Color, Urine AMBER (*)    APPearance CLOUDY (*)    Hgb urine dipstick SMALL (*)    Ketones, ur 80 (*)    Protein, ur >=300 (*)    Bacteria, UA MANY (*)    All other components within normal limits  I-STAT BETA HCG BLOOD, ED ([facility], [facility], [facility] ONLY)    EKG EKG Interpretation  Date/Time:  Monday July 21 2020 14:34:05 EST Ventricular Rate:  68 PR Interval:    QRS Duration: 91 QT Interval:  548 QTC Calculation: 588 R Axis:   74 Text Interpretation: Sinus rhythm Consider left ventricular hypertrophy Borderline abnrm T, anterolateral leads Prolonged QT interval Baseline wander in lead(s) aVF Artifact limiting read. No obvious STEMI Confirmed by Alona Bene [phone]) on 07/21/2020 2:38:01 PM   Radiology DG Chest Portable 1 View  Result Date: 07/21/2020 CLINICAL DATA:  39 year old female with cough. EXAM: PORTABLE CHEST 1  VIEW COMPARISON:  Chest radiograph dated 02/11/2020. FINDINGS: No focal consolidation, pleural effusion, pneumothorax. Mild cardiomegaly. No acute osseous pathology. Old healed left posterior rib fractures. IMPRESSION: No active disease. Electronically Signed   By: Elgie Collard M.D.   On: 07/21/2020 16:03    Procedures Procedures (including critical care time)  Medications Ordered in ED Medications  sodium chloride 0.9 % bolus 1,000 mL (0 mLs Intravenous Stopped 07/21/20 1804)  ondansetron (ZOFRAN) injection 4 mg (4 mg Intravenous Given 07/21/20 1642)  sodium chloride 0.9 % bolus 1,000 mL (1,000 mLs Intravenous New Bag/Given 07/21/20 1804)    ED Course  I have reviewed the triage vital signs and the nursing notes.  Pertinent labs & imaging results that were available during my care of the patient were reviewed by me and considered in my medical decision making (see chart for details).    [name] was evaluated in Emergency Department on 07/21/2020 for the symptoms described in the history of present illness. He/she was evaluated in the context of the global COVID-19 pandemic, which necessitated consideration that the patient might be at risk for infection with the SARS-CoV-2 virus that causes COVID-19. Institutional protocols and algorithms that pertain to the evaluation of patients at risk for COVID-19 are in a state of rapid change based on information released by regulatory bodies including the CDC and federal and state organizations. These policies and algorithms were followed during the patient's care in the ED.  MDM Rules/Calculators/A&P                          Patient presents to the ED with complaints of syncope x 2 today in the setting of not feeling well for the past 3-4 days with poor PO intake.  Patient is nontoxic, her vital signs are fairly unremarkable.  She has dry mucous membranes.  No focal neurologic deficits.  Additional history  obtained:  Additional  history obtained from chart review nursing note reviewed..   EKG: Initial EKG was significant artifact, subsequent EKG is more interpretable, no significant ischemic changes-reviewed with attending Dr. Jacqulyn Bath.  Lab Tests:  I Ordered, reviewed, and interpreted labs, which included:  CBC, CMP, urinalysis: Concerning for dehydration-with ketonuria and proteinuria as well as mild hyponatremia/hypochloremia.  Patient does have many bacteria, however grossly contaminated urine, she has no urinary symptoms, I do not suspect UTI.  Her anemia is improved from prior.  Her renal function is preserved.  Covid test is positive.  This is likely the underlying etiology of her symptoms she has been experiencing over the past 3 to 4 days.  Imaging Studies ordered:  I ordered imaging studies which included chest x-ray, I independently visualized and interpreted imaging which showed no acute process.   Cardiac monitor overall reassuring, no significant arrhythmias noted. Suspect syncope may have been related to dehydration. Patient was given fluid boluses.  She was also given Zofran.  She is tolerating p.o.  She is not hypoxic or in respiratory distress. Low risk wells- doubt PE.  Given her BMI she would qualify for monoclonal antibody infusion, she does not wish to receive this in the ER, she states that she would like to think about it, will send her information to the [facility] infusion center.   I discussed results, treatment plan, need for follow-up, and return precautions with the patient. Provided opportunity for questions, patient confirmed understanding and is in agreement with plan.   Portions of this note were generated with Scientist, clinical (histocompatibility and immunogenetics). Dictation errors may occur despite best attempts at proofreading.  Final Clinical Impression(s) / ED Diagnoses Final diagnoses:  COVID-19  Syncope, unspecified syncope type  Dehydration    Rx / DC Orders ED Discharge Orders         Ordered    ondansetron  (ZOFRAN ODT) 4 MG disintegrating tablet  Every 8 hours PRN        07/21/20 1818    benzonatate (TESSALON) 100 MG capsule  Every 8 hours        07/21/20 1818           Cherly Anderson, PA-C 07/21/20 1821    Maia Plan, MD 07/22/20 [phone]

## 2020-07-21 NOTE — ED Notes (Signed)
Orthostatic vitals done. Pt tolerated well. No c/o of dizziness. PA made aware.

## 2020-07-21 NOTE — ED Notes (Signed)
Pt ambulated to restroom with no issues.  

## 2020-07-21 NOTE — Discharge Instructions (Addendum)
You were seen in the emergency department today after passing out.  Your COVID-19 test was positive which we suspect is causing the majority of your symptoms, we suspect that you passed out due to being dehydrated, you were given fluids in the ER.  We are sending home with Zofran to take every 8 hours as needed for nausea and vomiting as well as Tessalon to take every hours as needed for coughing.  We have prescribed you new medication(s) today. Discuss the medications prescribed today with your pharmacist as they can have adverse effects and interactions with your other medicines including over the counter and prescribed medications. Seek medical evaluation if you start to experience new or abnormal symptoms after taking one of these medicines, seek care immediately if you start to experience difficulty breathing, feeling of your throat closing, facial swelling, or rash as these could be indications of a more serious allergic reaction  Please try to stay well-hydrated.  Your blood work showed that you are anemic, however this is improved from prior labs you have had done.  Your urine showed findings of ketones and protein which is likely due to being dehydrated, however we would like you to have this rechecked by your primary care provider within 2 weeks.  We are instructing patient's with COVID 19 or symptoms of COVID 19 to isolate themselves for 14 days. You may be able to discontinue self isolation if the following conditions are met:   Persons with COVID-19 who have symptoms and were directed to care for themselves at home may discontinue home isolation under the  following conditions: - It has been at least 7 days have passed since symptoms first appeared. - AND at least 3 days (72 hours) have passed since recovery defined as resolution of fever without the use of fever-reducing medications and improvement in respiratory symptoms (e.g., cough, shortness of breath)  Please follow the below  instructions.   Please follow up with primary care within 3-5 days for re-evaluation- call prior to going to the office to make them aware of your symptoms as some offices are altering their method of seeing patients with COVID 19 symptoms, we have also provided our [place] covid clinic for follow up as well.  Return to the ER for new or worsening symptoms including but not limited to increased work of breathing, chest pain, re occurrence of passing out, coughing up blood, or any other concerns.       Person Under Monitoring Name: [name]  Location: [address] [place] [address]   Infection Prevention Recommendations for Individuals Confirmed to have, or Being Evaluated for, 2019 Novel Coronavirus (COVID-19) Infection Who Receive Care at Home  Individuals who are confirmed to have, or are being evaluated for, COVID-19 should follow the prevention steps below until a healthcare provider or local or state health department says they can return to normal activities.  Stay home except to get medical care You should restrict activities outside your home, except for getting medical care. Do not go to work, school, or public areas, and do not use public transportation or taxis.  Call ahead before visiting your doctor Before your medical appointment, call the healthcare provider and tell them that you have, or are being evaluated for, COVID-19 infection. This will help the healthcare provider's office take steps to keep other people from getting infected. Ask your healthcare provider to call the local or state health department.  Monitor your symptoms Seek prompt medical attention if your illness  is worsening (e.g., difficulty breathing). Before going to your medical appointment, call the healthcare provider and tell them that you have, or are being evaluated for, COVID-19 infection. Ask your healthcare provider to call the local or state health department.  Wear  a facemask You should wear a facemask that covers your nose and mouth when you are in the same room with other people and when you visit a healthcare provider. People who live with or visit you should also wear a facemask while they are in the same room with you.  Separate yourself from other people in your home As much as possible, you should stay in a different room from other people in your home. Also, you should use a separate bathroom, if available.  Avoid sharing household items You should not share dishes, drinking glasses, cups, eating utensils, towels, bedding, or other items with other people in your home. After using these items, you should wash them thoroughly with soap and water.  Cover your coughs and sneezes Cover your mouth and nose with a tissue when you cough or sneeze, or you can cough or sneeze into your sleeve. Throw used tissues in a lined trash can, and immediately wash your hands with soap and water for at least 20 seconds or use an alcohol-based hand rub.  Wash your [facility] your hands often and thoroughly with soap and water for at least 20 seconds. You can use an alcohol-based hand sanitizer if soap and water are not available and if your hands are not visibly dirty. Avoid touching your eyes, nose, and mouth with unwashed hands.   Prevention Steps for Caregivers and Household Members of Individuals Confirmed to have, or Being Evaluated for, COVID-19 Infection Being Cared for in the Home  If you live with, or provide care at home for, a person confirmed to have, or being evaluated for, COVID-19 infection please follow these guidelines to prevent infection:  Follow healthcare provider's instructions Make sure that you understand and can help the patient follow any healthcare provider instructions for all care.  Provide for the patient's basic needs You should help the patient with basic needs in the home and provide support for getting groceries,  prescriptions, and other personal needs.  Monitor the patient's symptoms If they are getting sicker, call his or her medical provider and tell them that the patient has, or is being evaluated for, COVID-19 infection. This will help the healthcare provider's office take steps to keep other people from getting infected. Ask the healthcare provider to call the local or state health department.  Limit the number of people who have contact with the patient If possible, have only one caregiver for the patient. Other household members should stay in another home or place of residence. If this is not possible, they should stay in another room, or be separated from the patient as much as possible. Use a separate bathroom, if available. Restrict visitors who do not have an essential need to be in the home.  Keep older adults, very young children, and other sick people away from the patient Keep older adults, very young children, and those who have compromised immune systems or chronic health conditions away from the patient. This includes people with chronic heart, lung, or kidney conditions, diabetes, and cancer.  Ensure good ventilation Make sure that shared spaces in the home have good air flow, such as from an air conditioner or an opened window, weather permitting.  Wash your hands often Wash your hands  often and thoroughly with soap and water for at least 20 seconds. You can use an alcohol based hand sanitizer if soap and water are not available and if your hands are not visibly dirty. Avoid touching your eyes, nose, and mouth with unwashed hands. Use disposable paper towels to dry your hands. If not available, use dedicated cloth towels and replace them when they become wet.  Wear a facemask and gloves Wear a disposable facemask at all times in the room and gloves when you touch or have contact with the patient's blood, body fluids, and/or secretions or excretions, such as sweat, saliva,  sputum, nasal mucus, vomit, urine, or feces.  Ensure the mask fits over your nose and mouth tightly, and do not touch it during use. Throw out disposable facemasks and gloves after using them. Do not reuse. Wash your hands immediately after removing your facemask and gloves. If your personal clothing becomes contaminated, carefully remove clothing and launder. Wash your hands after handling contaminated clothing. Place all used disposable facemasks, gloves, and other waste in a lined container before disposing them with other household waste. Remove gloves and wash your hands immediately after handling these items.  Do not share dishes, glasses, or other household items with the patient Avoid sharing household items. You should not share dishes, drinking glasses, cups, eating utensils, towels, bedding, or other items with a patient who is confirmed to have, or being evaluated for, COVID-19 infection. After the person uses these items, you should wash them thoroughly with soap and water.  Wash laundry thoroughly Immediately remove and wash clothes or bedding that have blood, body fluids, and/or secretions or excretions, such as sweat, saliva, sputum, nasal mucus, vomit, urine, or feces, on them. Wear gloves when handling laundry from the patient. Read and follow directions on labels of laundry or clothing items and detergent. In general, wash and dry with the warmest temperatures recommended on the label.  Clean all areas the individual has used often Clean all touchable surfaces, such as counters, tabletops, doorknobs, bathroom fixtures, toilets, phones, keyboards, tablets, and bedside tables, every day. Also, clean any surfaces that may have blood, body fluids, and/or secretions or excretions on them. Wear gloves when cleaning surfaces the patient has come in contact with. Use a diluted bleach solution (e.g., dilute bleach with 1 part bleach and 10 parts water) or a household disinfectant with a  label that says EPA-registered for coronaviruses. To make a bleach solution at home, add 1 tablespoon of bleach to 1 quart (4 cups) of water. For a larger supply, add  cup of bleach to 1 gallon (16 cups) of water. Read labels of cleaning products and follow recommendations provided on product labels. Labels contain instructions for safe and effective use of the cleaning product including precautions you should take when applying the product, such as wearing gloves or eye protection and making sure you have good ventilation during use of the product. Remove gloves and wash hands immediately after cleaning.  Monitor yourself for signs and symptoms of illness Caregivers and household members are considered close contacts, should monitor their health, and will be asked to limit movement outside of the home to the extent possible. Follow the monitoring steps for close contacts listed on the symptom monitoring form.   ? If you have additional questions, contact your local health department or call the epidemiologist on call at [phone] (available 24/7). ? This guidance is subject to change. For the most up-to-date guidance from [facility], please refer to  their website: [email or web address]

## 2020-07-21 NOTE — ED Notes (Signed)
Pts ride here. Pt discharged.

## 2020-07-22 ENCOUNTER — Telehealth: Payer: Self-pay | Admitting: Oncology

## 2020-07-22 ENCOUNTER — Encounter: Payer: Self-pay | Admitting: Oncology

## 2020-07-22 LAB — NOVEL CORONAVIRUS, NAA: SARS-CoV-2, NAA: DETECTED — AB

## 2020-07-22 NOTE — Telephone Encounter (Signed)
Called to Discuss with patient about Covid symptoms and the use of the monoclonal antibody infusion for those with mild to moderate Covid symptoms and at a high risk of hospitalization.     Pt is qualified for this infusion due to co-morbid conditions and/or a member of an at-risk group.     Patient Active Problem List   Diagnosis Date Noted  . Small bowel obstruction (HCC) 02/14/2020  . Abdominal pain 02/14/2020  . Sebaceous cyst of right axilla 11/03/2015  . Hypertension     Patient declines infusion at this time.  Patient would like to think about the infusion and get back to [place].  I have sent her information via MyChart for her to review.  Symptom onset was July 17, 2020.   She understands we would likely only be able to give her the infusion until tomorrow.  Symptoms tier reviewed as well as criteria for ending isolation. Preventative practices reviewed. Patient verbalized understanding.   Patient advised to call back if he/she decides that he/she does want to get infusion. Callback number to the infusion center given. Patient advised to go to Urgent care or ED with severe symptoms.   Durenda Hurt, NP 07/22/2020 12:46 PM

## 2020-07-23 LAB — I-STAT BETA HCG BLOOD, ED (MC, WL, AP ONLY): I-stat hCG, quantitative: <5 m[IU]/mL (ref <5)

## 2020-07-29 ENCOUNTER — Telehealth: Payer: Self-pay | Admitting: Nurse Practitioner

## 2020-07-29 ENCOUNTER — Encounter (INDEPENDENT_AMBULATORY_CARE_PROVIDER_SITE_OTHER): Payer: 59 | Admitting: Nurse Practitioner

## 2020-07-29 NOTE — Telephone Encounter (Signed)
Nurse attempted 3 calls to contact patient for tele-visit. No answer. No available voice mail.

## 2020-07-29 NOTE — Progress Notes (Signed)
Attempted to contact patient 3x, voicemail is not set up. Unable to reach patient.

## 2020-08-05 HISTORY — DX: *Unknown: ERROR EN

## 2020-08-05 NOTE — Progress Notes (Signed)
No show.  Erroneous encounter

## 2020-08-06 ENCOUNTER — Other Ambulatory Visit: Payer: 59

## 2020-08-06 DIAGNOSIS — Z20822 Contact with and (suspected) exposure to covid-19: Secondary | ICD-10-CM

## 2020-08-07 ENCOUNTER — Ambulatory Visit: Payer: 59 | Attending: Internal Medicine

## 2020-08-07 DIAGNOSIS — Z23 Encounter for immunization: Secondary | ICD-10-CM

## 2020-08-07 LAB — SARS-COV-2, NAA 2 DAY TAT

## 2020-08-07 LAB — NOVEL CORONAVIRUS, NAA: SARS-CoV-2, NAA: NOT DETECTED

## 2020-08-07 NOTE — Progress Notes (Signed)
   Covid-19 Vaccination Clinic  Name:  [name]    MRN: [id number] DOB: 1980/09/15  08/07/2020  [name] was observed post Covid-19 immunization for 15 minutes without incident. She was provided with Vaccine Information Sheet and instruction to access the V-Safe system.   [name] was instructed to call 911 with any severe reactions post vaccine: Marland Kitchen Difficulty breathing  . Swelling of face and throat  . A fast heartbeat  . A bad rash all over body  . Dizziness and weakness   Immunizations Administered    Name Date Dose VIS Date Route   Moderna COVID-19 Vaccine 08/07/2020  1:22 PM 0.5 mL 05/21/2020 Intramuscular   Manufacturer: Gala Murdoch   Lot: [id number]   NDC: 12458-099-83

## 2020-08-19 ENCOUNTER — Encounter: Payer: Self-pay | Admitting: Family Medicine

## 2020-08-19 ENCOUNTER — Ambulatory Visit: Payer: 59 | Admitting: Family Medicine

## 2020-08-19 ENCOUNTER — Telehealth: Payer: Self-pay | Admitting: Internal Medicine

## 2020-08-19 ENCOUNTER — Other Ambulatory Visit: Payer: Self-pay

## 2020-08-19 VITALS — BP 140/82 | HR 71 | Ht 63.0 in | Wt 204.4 lb

## 2020-08-19 DIAGNOSIS — T50B95A Adverse effect of other viral vaccines, initial encounter: Secondary | ICD-10-CM

## 2020-08-19 DIAGNOSIS — E7800 Pure hypercholesterolemia, unspecified: Secondary | ICD-10-CM

## 2020-08-19 DIAGNOSIS — R413 Other amnesia: Secondary | ICD-10-CM

## 2020-08-19 DIAGNOSIS — H538 Other visual disturbances: Secondary | ICD-10-CM | POA: Diagnosis not present

## 2020-08-19 DIAGNOSIS — Z8616 Personal history of COVID-19: Secondary | ICD-10-CM | POA: Diagnosis not present

## 2020-08-19 DIAGNOSIS — M791 Myalgia, unspecified site: Secondary | ICD-10-CM

## 2020-08-19 DIAGNOSIS — I10 Essential (primary) hypertension: Secondary | ICD-10-CM

## 2020-08-19 DIAGNOSIS — E669 Obesity, unspecified: Secondary | ICD-10-CM

## 2020-08-19 DIAGNOSIS — R0789 Other chest pain: Secondary | ICD-10-CM
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
## 2020-08-19 NOTE — Patient Instructions (Addendum)
[email or web address]">   Keep an eye on your blood pressure at home.  You will need to buy blood pressure cuff. Goal blood pressure is less than 130/80.  Limit your sodium intake  Follow-up in 4 weeks and bring in your blood pressure machine and your readings.  I am referring you to the post-COVID clinic and you should hear from them soon.  Let me know if you do not hear from them in the next 2 days.  Call and schedule an eye exam   DASH Eating Plan DASH stands for Dietary Approaches to Stop Hypertension. The DASH eating plan is a healthy eating plan that has been shown to:  Reduce high blood pressure (hypertension).  Reduce your risk for type 2 diabetes, heart disease, and stroke.  Help with weight loss. What are tips for following this plan? Reading food labels  Check food labels for the amount of salt (sodium) per serving. Choose foods with less than 5 percent of the Daily Value of sodium. Generally, foods with less than 300 milligrams (mg) of sodium per serving fit into this eating plan.  To find whole grains, look for the word "whole" as the first word in the ingredient list. Shopping  Buy products labeled as "low-sodium" or "no salt added."  Buy fresh foods. Avoid canned foods and pre-made or frozen meals. Cooking  Avoid adding salt when cooking. Use salt-free seasonings or herbs instead of table salt or sea salt. Check with your health care provider or pharmacist before using salt substitutes.  Do not fry foods. Cook foods using healthy methods such as baking, boiling, grilling, roasting, and broiling instead.  Cook with heart-healthy oils, such as olive, canola, avocado, soybean, or sunflower oil. Meal planning  Eat a balanced diet that includes: ? 4 or more servings of fruits and 4 or more servings of vegetables each day. Try to fill one-half of your plate with fruits and vegetables. ? 6-8 servings of whole grains each  day. ? Less than 6 oz (170 g) of lean meat, poultry, or fish each day. A 3-oz (85-g) serving of meat is about the same size as a deck of cards. One egg equals 1 oz (28 g). ? 2-3 servings of low-fat dairy each day. One serving is 1 cup (237 mL). ? 1 serving of nuts, seeds, or beans 5 times each week. ? 2-3 servings of heart-healthy fats. Healthy fats called omega-3 fatty acids are found in foods such as walnuts, flaxseeds, fortified milks, and eggs. These fats are also found in cold-water fish, such as sardines, salmon, and mackerel.  Limit how much you eat of: ? Canned or prepackaged foods. ? Food that is high in trans fat, such as some fried foods. ? Food that is high in saturated fat, such as fatty meat. ? Desserts and other sweets, sugary drinks, and other foods with added sugar. ? Full-fat dairy products.  Do not salt foods before eating.  Do not eat more than 4 egg yolks a week.  Try to eat at least 2 vegetarian meals a week.  Eat more home-cooked food and less restaurant, buffet, and fast food.   Lifestyle  When eating at a restaurant, ask that your food be prepared with less salt or no salt, if possible.  If you drink alcohol: ? Limit how much you use to:  0-1 drink a day for women who are not pregnant.  0-2 drinks a day for men. ? Be aware of how much alcohol is in your  drink. In the [place] one drink equals one 12 oz bottle of beer (355 mL), one 5 oz glass of wine (148 mL), or one 1 oz glass of hard liquor (44 mL). General information  Avoid eating more than 2,300 mg of salt a day. If you have hypertension, you may need to reduce your sodium intake to 1,500 mg a day.  Work with your health care provider to maintain a healthy body weight or to lose weight. Ask what an ideal weight is for you.  Get at least 30 minutes of exercise that causes your heart to beat faster (aerobic exercise) most days of the week. Activities may include walking, swimming, or biking.  Work with  your health care provider or dietitian to adjust your eating plan to your individual calorie needs. What foods should I eat? Fruits All fresh, dried, or frozen fruit. Canned fruit in natural juice (without added sugar). Vegetables Fresh or frozen vegetables (raw, steamed, roasted, or grilled). Low-sodium or reduced-sodium tomato and vegetable juice. Low-sodium or reduced-sodium tomato sauce and tomato paste. Low-sodium or reduced-sodium canned vegetables. Grains Whole-grain or whole-wheat bread. Whole-grain or whole-wheat pasta. Brown rice. Orpah Cobb. Bulgur. Whole-grain and low-sodium cereals. Pita bread. Low-fat, low-sodium crackers. Whole-wheat flour tortillas. Meats and other proteins Skinless chicken or [place]. Ground chicken or [place]. Pork with fat trimmed off. Fish and seafood. Egg whites. Dried beans, peas, or lentils. Unsalted nuts, nut butters, and seeds. Unsalted canned beans. Lean cuts of beef with fat trimmed off. Low-sodium, lean precooked or cured meat, such as sausages or meat loaves. Dairy Low-fat (1%) or fat-free (skim) milk. Reduced-fat, low-fat, or fat-free cheeses. Nonfat, low-sodium ricotta or cottage cheese. Low-fat or nonfat yogurt. Low-fat, low-sodium cheese. Fats and oils Soft margarine without trans fats. Vegetable oil. Reduced-fat, low-fat, or light mayonnaise and salad dressings (reduced-sodium). Canola, safflower, olive, avocado, soybean, and sunflower oils. Avocado. Seasonings and condiments Herbs. Spices. Seasoning mixes without salt. Other foods Unsalted popcorn and pretzels. Fat-free sweets. The items listed above may not be a complete list of foods and beverages you can eat. Contact a dietitian for more information. What foods should I avoid? Fruits Canned fruit in a light or heavy syrup. Fried fruit. Fruit in cream or butter sauce. Vegetables Creamed or fried vegetables. Vegetables in a cheese sauce. Regular canned vegetables (not low-sodium or  reduced-sodium). Regular canned tomato sauce and paste (not low-sodium or reduced-sodium). Regular tomato and vegetable juice (not low-sodium or reduced-sodium). Rosita Fire. Olives. Grains Baked goods made with fat, such as croissants, muffins, or some breads. Dry pasta or rice meal packs. Meats and other proteins Fatty cuts of meat. Ribs. Fried meat. Tomasa Blase. Bologna, salami, and other precooked or cured meats, such as sausages or meat loaves. Fat from the back of a pig (fatback). Bratwurst. Salted nuts and seeds. Canned beans with added salt. Canned or smoked fish. Whole eggs or egg yolks. Chicken or [place] with skin. Dairy Whole or 2% milk, cream, and half-and-half. Whole or full-fat cream cheese. Whole-fat or sweetened yogurt. Full-fat cheese. Nondairy creamers. Whipped toppings. Processed cheese and cheese spreads. Fats and oils Butter. Stick margarine. Lard. Shortening. Ghee. Bacon fat. Tropical oils, such as coconut, palm kernel, or palm oil. Seasonings and condiments Onion salt, garlic salt, seasoned salt, table salt, and sea salt. Worcestershire sauce. Tartar sauce. Barbecue sauce. Teriyaki sauce. Soy sauce, including reduced-sodium. Steak sauce. Canned and packaged gravies. Fish sauce. Oyster sauce. Cocktail sauce. Store-bought horseradish. Ketchup. Mustard. Meat flavorings and tenderizers. Bouillon cubes. Hot sauces. Pre-made  or packaged marinades. Pre-made or packaged taco seasonings. Relishes. Regular salad dressings. Other foods Salted popcorn and pretzels. The items listed above may not be a complete list of foods and beverages you should avoid. Contact a dietitian for more information. Where to find more information  National Heart, Lung, and Blood Institute: PopSteam.is  American Heart Association: www.heart.org  Academy of Nutrition and Dietetics: www.eatright.org  National Kidney Foundation: www.kidney.org Summary  The DASH eating plan is a healthy eating plan that has  been shown to reduce high blood pressure (hypertension). It may also reduce your risk for type 2 diabetes, heart disease, and stroke.  When on the DASH eating plan, aim to eat more fresh fruits and vegetables, whole grains, lean proteins, low-fat dairy, and heart-healthy fats.  With the DASH eating plan, you should limit salt (sodium) intake to 2,300 mg a day. If you have hypertension, you may need to reduce your sodium intake to 1,500 mg a day.  Work with your health care provider or dietitian to adjust your eating plan to your individual calorie needs. This information is not intended to replace advice given to you by your health care provider. Make sure you discuss any questions you have with your health care provider. Document Revised: 06/22/2019 Document Reviewed: 06/22/2019 Elsevier Patient Education  2021 [employer].

## 2020-08-19 NOTE — Telephone Encounter (Signed)
Called post covid clinic and left a message for them to call to schedule appt with pt

## 2020-08-20 LAB — COMPREHENSIVE METABOLIC PANEL
ALT: 18 IU/L (ref 0–32)
AST: 13 IU/L (ref 0–40)
Albumin/Globulin Ratio: 1.1 — ABNORMAL LOW (ref 1.2–2.2)
Albumin: 3.4 g/dL — ABNORMAL LOW (ref 3.8–4.8)
Alkaline Phosphatase: 66 IU/L (ref 44–121)
BUN/Creatinine Ratio: 8 — ABNORMAL LOW (ref 9–23)
BUN: 6 mg/dL (ref 6–24)
Bilirubin Total: 0.3 mg/dL (ref 0.0–1.2)
CO2: 24 mmol/L (ref 20–29)
Calcium: 8.5 mg/dL — ABNORMAL LOW (ref 8.7–10.2)
Chloride: 105 mmol/L (ref 96–106)
Creatinine, Ser: 0.72 mg/dL (ref 0.57–1.00)
GFR calc Af Amer: 121 mL/min/{1.73_m2} (ref >59)
GFR calc non Af Amer: 105 mL/min/{1.73_m2} (ref >59)
Globulin, Total: 3.1 g/dL (ref 1.5–4.5)
Glucose: 84 mg/dL (ref 65–99)
Potassium: 4.2 mmol/L (ref 3.5–5.2)
Sodium: 141 mmol/L (ref 134–144)
Total Protein: 6.5 g/dL (ref 6.0–8.5)

## 2020-08-20 LAB — CBC WITH DIFFERENTIAL/PLATELET
Basophils Absolute: 0.1 10*3/uL (ref 0.0–0.2)
Basos: 1 %
EOS (ABSOLUTE): 0.1 10*3/uL (ref 0.0–0.4)
Eos: 1 %
Hematocrit: 29.3 % — ABNORMAL LOW (ref 34.0–46.6)
Hemoglobin: 8.7 g/dL — ABNORMAL LOW (ref 11.1–15.9)
Immature Grans (Abs): 0 10*3/uL (ref 0.0–0.1)
Immature Granulocytes: 0 %
Lymphocytes Absolute: 1.5 10*3/uL (ref 0.7–3.1)
Lymphs: 35 %
MCH: 23.3 pg — ABNORMAL LOW (ref 26.6–33.0)
MCHC: 29.7 g/dL — ABNORMAL LOW (ref 31.5–35.7)
MCV: 79 fL (ref 79–97)
Monocytes Absolute: 0.6 10*3/uL (ref 0.1–0.9)
Monocytes: 15 %
Neutrophils Absolute: 2 10*3/uL (ref 1.4–7.0)
Neutrophils: 48 %
Platelets: 508 10*3/uL — ABNORMAL HIGH (ref 150–450)
RBC: 3.73 x10E6/uL — ABNORMAL LOW (ref 3.77–5.28)
RDW: 18.4 % — ABNORMAL HIGH (ref 11.7–15.4)
WBC: 4.1 10*3/uL (ref 3.4–10.8)

## 2020-08-20 LAB — T3: T3, Total: 158 ng/dL (ref 71–180)

## 2020-08-20 LAB — LIPID PANEL
Chol/HDL Ratio: 3.6 ratio (ref 0.0–4.4)
Cholesterol, Total: 248 mg/dL — ABNORMAL HIGH (ref 100–199)
HDL: 68 mg/dL (ref >39)
LDL Chol Calc (NIH): 161 mg/dL — ABNORMAL HIGH (ref 0–99)
Triglycerides: 110 mg/dL (ref 0–149)
VLDL Cholesterol Cal: 19 mg/dL (ref 5–40)

## 2020-08-20 LAB — TSH: TSH: 1.64 u[IU]/mL (ref 0.450–4.500)

## 2020-08-20 LAB — T4, FREE: Free T4: 1.15 ng/dL (ref 0.82–1.77)

## 2020-08-21 ENCOUNTER — Encounter: Payer: Self-pay | Admitting: Family Medicine

## 2020-08-21 DIAGNOSIS — D649 Anemia, unspecified: Secondary | ICD-10-CM | POA: Insufficient documentation

## 2020-08-21 DIAGNOSIS — D75839 Thrombocytosis, unspecified: Secondary | ICD-10-CM | POA: Insufficient documentation

## 2020-08-21 DIAGNOSIS — Z9081 Acquired absence of spleen: Secondary | ICD-10-CM

## 2020-08-21 HISTORY — DX: Acquired absence of spleen: Z90.81

## 2020-08-21 HISTORY — DX: Thrombocytosis, unspecified: D75.839

## 2020-08-21 HISTORY — DX: Anemia, unspecified: D64.9

## 2020-08-21 NOTE — Progress Notes (Signed)
Please call and let her know that she has anemia.  This may be due to heavy periods but please ask her if she is having heavy and prolonged vaginal bleeding with her cycles.  Her platelets are also elevated but they have been in the past as well.  I recommend that she start taking oral iron over-the-counter.  She can take this once or twice daily.  I also recommend that she start taking a stool softener with each iron dose.  We need to recheck her blood counts and platelets in 4 weeks after taking oral iron. Her LDL or bad cholesterol is also elevated.  I strongly encouraged her to make sure she is eating a low-fat diet.  Limit fried foods Does she have an appt at the post covid clinic?

## 2020-08-25 ENCOUNTER — Telehealth (INDEPENDENT_AMBULATORY_CARE_PROVIDER_SITE_OTHER): Payer: 59 | Admitting: Nurse Practitioner

## 2020-08-25 DIAGNOSIS — R079 Chest pain, unspecified: Secondary | ICD-10-CM | POA: Diagnosis not present

## 2020-08-25 DIAGNOSIS — R41840 Attention and concentration deficit: Secondary | ICD-10-CM

## 2020-08-25 DIAGNOSIS — Z8616 Personal history of COVID-19: Secondary | ICD-10-CM | POA: Diagnosis not present

## 2020-08-25 DIAGNOSIS — D509 Iron deficiency anemia, unspecified: Secondary | ICD-10-CM

## 2020-08-25 DIAGNOSIS — R002 Palpitations: Secondary | ICD-10-CM | POA: Diagnosis not present

## 2020-08-25 DIAGNOSIS — U099 Post covid-19 condition, unspecified: Secondary | ICD-10-CM

## 2020-08-25 NOTE — Progress Notes (Signed)
Virtual Visit via Telephone Note  I connected with [name] on 08/25/20 at  1:00 PM EST by telephone and verified that I am speaking with the correct person using two identifiers.  Location: Patient: home Provider: remote   I discussed the limitations, risks, security and privacy concerns of performing an evaluation and management service by telephone and the availability of in person appointments. I also discussed with the patient that there may be a patient responsible charge related to this service. The patient expressed understanding and agreed to proceed.  Chief Complaint  Patient presents with  . New Patient (Initial Visit)    COVID +07/2020 Sx: Back ache, blurry vision, brain fog, heart palpitations.     History of Present Illness:  Patient presents today for post COVID care clinic visit through televisit.  Patient states that she tested positive for Covid on July 21, 2020.  She did have her first Covid vaccine on August 07, 2020.  Patient states that she has been experiencing extreme brain fog since having Covid.  She is also having some back pain and blurred vision.  She also complains of heart palpitations, intermittent chest pain, and feels like her heart is racing at times.  Patient was recently diagnosed with anemia through PCP and was started on iron replacement supplements.  We discussed that some of her issues could be related to this.  We will place a referral to cardiology and to speech therapy for cognitive rehabilitation. Denies f/c/s, n/v/d, hemoptysis, PND, edema.      Observations/Objective:  Vitals with BMI 08/19/2020 07/21/2020 07/21/2020  Height 5\' 3"  - -  Weight 204 lbs 6 oz - -  BMI 36.22 - -  Systolic 140 152  Diastolic 82 72 98  Pulse 71 90 -      Assessment and Plan:  Covid 19 Brain fog Heart palpitations Anemia:   Stay well hydrated  Stay active  Deep breathing exercises  May start vitamin C daily, vitamin D3  daily, Zinc daily  May take tylenol or fever or pain  Will place referral to cardiology  Will place referral to speech therapy for cognitive evaluation    Follow Up Instructions:  Follow up in 3 weeks or sooner if needed     I discussed the assessment and treatment plan with the patient. The patient was provided an opportunity to ask questions and all were answered. The patient agreed with the plan and demonstrated an understanding of the instructions.   The patient was advised to call back or seek an in-person evaluation if the symptoms worsen or if the condition fails to improve as anticipated.  I provided 22 minutes of non-face-to-face time during this encounter.   195, NP

## 2020-08-25 NOTE — Patient Instructions (Addendum)
Covid 19 Brain fog Heart palpitations Anemia:   Stay well hydrated  Stay active  Deep breathing exercises  May start vitamin C daily, vitamin D3 daily, Zinc daily  May take tylenol or fever or pain  Will place referral to cardiology  Will place referral to speech therapy for cognitive evaluation    Follow up:  Follow up in 3 weeks or sooner if needed

## 2020-09-03 ENCOUNTER — Encounter: Payer: Self-pay | Admitting: Internal Medicine

## 2020-09-04 ENCOUNTER — Ambulatory Visit: Payer: 59

## 2020-09-10 ENCOUNTER — Other Ambulatory Visit: Payer: Self-pay

## 2020-09-10 ENCOUNTER — Encounter: Payer: Self-pay | Admitting: Cardiology

## 2020-09-10 ENCOUNTER — Ambulatory Visit: Payer: 59 | Admitting: Cardiology

## 2020-09-10 VITALS — BP 118/90 | HR 79 | Ht 63.0 in | Wt 204.4 lb

## 2020-09-10 DIAGNOSIS — Z8616 Personal history of COVID-19: Secondary | ICD-10-CM | POA: Diagnosis not present

## 2020-09-10 DIAGNOSIS — Z7189 Other specified counseling: Secondary | ICD-10-CM

## 2020-09-10 DIAGNOSIS — R0789 Other chest pain: Secondary | ICD-10-CM | POA: Diagnosis not present

## 2020-09-10 DIAGNOSIS — M940 Chondrocostal junction syndrome [Tietze]: Secondary | ICD-10-CM

## 2020-09-10 NOTE — Patient Instructions (Signed)

## 2020-09-10 NOTE — Progress Notes (Signed)
Cardiology Office Note:    Date:  09/10/2020   ID:  [name], DOB 12/04/1980, MRN [id number]  PCP:  Avanell Shackleton, NP-C  Cardiologist:  Jodelle Red, MD  Referring MD: Ivonne Andrew, NP   CC: new patient consultation for chest pain  History of Present Illness:    [name] is a 40 y.o. female with a hx of recent Covid infection who is seen as a new consult at the request of Ivonne Andrew, NP for the evaluation and management of chest pain.  Note reviewed from 08/25/20. Noted to have been Covid positive 07/2020, received 1st Covid shot 08/07/20. Noted heart palpitations, chest pain. Also recently diagnosed with anemia and placed on iron supplements  Chest pain/palpitations: -Initial onset: after her Covid infection in December 2021. Diagnosed 12/20.  -Quality: shooting pain, sharp/burning but not acid reflux like. Moves across her chest from left to right.  -Frequency: every other day -Duration: several minutes -Associated symptoms: mild shortness of breath, nausea, dizzy -Aggravating/alleviating factors: none that she can tell -Prior cardiac history: none -Prior ECG: NSR, borderline LVH -Prior workup: none -Prior treatment: none -Alcohol: never/none -Tobacco: never -Comorbidities: high blood pressure after a pregnancy, improved with losing weight. Has been told she has high cholesterol. No diabetes or kidney disease -Exercise level: tries to walk 30 minutes daily -Cardiac ROS: no shortness of breath, no PND, no orthopnea, no LE edema, no syncope -Family history: father had two open heart surgeries, had diabetes, passed away last year at age 16. No other history that she knows of.  Past Medical History:  Diagnosis Date  . Anemia 08/21/2020  . Aneurysm (HCC)   . 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 Hypertension   . Thrombocytosis 08/21/2020    Past Surgical History:  Procedure Laterality Date  . SPLENECTOMY, TOTAL      Current  Medications: Current Outpatient Medications on File Prior to Visit  Medication Sig  . Multiple Vitamin (MULTIVITAMIN WITH MINERALS) TABS tablet Take 1 tablet by mouth daily.  . Norgestimate-Ethinyl Estradiol Triphasic (TRI-LO-SPRINTEC) 0.18/0.215/0.25 MG-25 MCG tab Take 1 tablet by mouth daily.  . ondansetron (ZOFRAN ODT) 4 MG disintegrating tablet Take 1 tablet (4 mg total) by mouth every 8 (eight) hours as needed for nausea or vomiting. (Patient not taking: No sig reported)   No current facility-administered medications on file prior to visit.     Allergies:   Patient has no known allergies.   Social History   Tobacco Use  . Smoking status: Never Smoker  . Smokeless tobacco: Never Used  Vaping Use  . Vaping Use: Never used  Substance Use Topics  . Alcohol use: No    Alcohol/week: 0.0 standard drinks  . Drug use: No    Family History: family history includes Asthma in her daughter; Diabetes in her father and mother; Hypertension in her father and mother.  ROS:   Please see the history of present illness.  Additional pertinent ROS: Constitutional: Negative for chills, fever, night sweats, unintentional weight loss  HENT: Negative for ear pain and hearing loss.   Eyes: Negative for loss of vision and eye pain.  Respiratory: Negative for cough, sputum, wheezing.   Cardiovascular: See HPI. Gastrointestinal: Negative for abdominal pain, melena, and hematochezia.  Genitourinary: Negative for dysuria and hematuria.  Musculoskeletal: Negative for falls and myalgias.  Skin: Negative for itching and rash.  Neurological: Negative for focal weakness, focal sensory changes and loss of consciousness.  Endo/Heme/Allergies: Does not bruise/bleed easily.  EKGs/Labs/Other Studies Reviewed:    The following studies were reviewed today: No prior cardiac studies  EKG:  EKG is personally reviewed.  The ekg ordered today demonstrates NSR at 79 bpm  Recent Labs: 02/15/2020: Magnesium  1.6 08/19/2020: ALT 18; BUN 6; Creatinine, Ser 0.72; Hemoglobin 8.7; Platelets 508; Potassium 4.2; Sodium 141; TSH 1.640  Recent Lipid Panel    Component Value Date/Time   CHOL 248 (H) 08/19/2020 1105   TRIG 110 08/19/2020 1105   HDL 68 08/19/2020 1105   CHOLHDL 3.6 08/19/2020 1105   LDLCALC 161 (H) 08/19/2020 1105    Physical Exam:    VS:  BP 118/90 (BP Location: Left Arm, Patient Position: Sitting)   Pulse 79   Ht 5\' 3"  (1.6 m)   Wt 204 lb 6.4 oz (92.7 kg)   SpO2 97%   BMI 36.21 kg/m     Wt Readings from Last 3 Encounters:  09/10/20 204 lb 6.4 oz (92.7 kg)  08/19/20 204 lb 6.4 oz (92.7 kg)  07/21/20 202 lb 13.2 oz (92 kg)    GEN: Well nourished, well developed in no acute distress HEENT: Normal, moist mucous membranes NECK: No JVD CARDIAC: regular rhythm, normal S1 and S2, no rubs or gallops. No murmur. Tender to palpation over left pectoral region, in area from 2nd-4th rib midclavicular. VASCULAR: Radial and DP pulses 2+ bilaterally. No carotid bruits RESPIRATORY:  Clear to auscultation without rales, wheezing or rhonchi  ABDOMEN: Soft, non-tender, non-distended MUSCULOSKELETAL:  Ambulates independently SKIN: Warm and dry, no edema NEUROLOGIC:  Alert and oriented x 3. No focal neuro deficits noted. PSYCHIATRIC:  Normal affect    ASSESSMENT:    1. Atypical chest pain   2. Costochondritis   3. Personal history of COVID-19   4. Cardiac risk counseling    PLAN:    Chest pain We spent significant time today reviewing different parts of the cardiovascular system (electrical, vascular, functional, and valvular). We discussed how each of these systems can present with different symptoms. We reviewed that there are different ways we evaluate these symptoms with tests. We reviewed which tests I think are most appropriate given the symptoms, and we discussed risks/benefits and limitations of each of these tests. Please see summary below. We also discussed that if testing is  unrevealing for a cardiac cause of the symptoms, there are many noncardiac causes as well that can contribute to symptoms. If the heart is ruled out, then I recommend returning to PCP to discuss alternative diagnoses. -we did discuss both exercise treadmill stress test and cardiac CT for further evaluation. After shared decision making and tenderness on exam, we will not pursue additional testing at this time. Most likely diagnosis is costochondritis. -if symptoms worsen or fail to improve, would then consider further testing  History of Covid-19: initial cause of symptoms -pain is not pleuritic in nature -no signs/symptoms of clinical heart failure  Cardiac risk counseling and prevention recommendations: -recommend heart healthy/Mediterranean diet, with whole grains, fruits, vegetable, fish, lean meats, nuts, and olive oil. Limit salt. -recommend moderate walking, 3-5 times/week for 30-50 minutes each session. Aim for at least 150 minutes.week. Goal should be pace of 3 miles/hours, or walking 1.5 miles in 30 minutes -recommend avoidance of tobacco products. Avoid excess alcohol. -ASCVD risk score: The 10-year ASCVD risk score 07/23/20 DC Jr., et al., 2013) is: 0.3%   Values used to calculate the score:     Age: 71 years     Sex: Female  Is Non-Hispanic African American: Yes     Diabetic: No     Tobacco smoker: No     Systolic Blood Pressure: 118 mmHg     Is BP treated: No     HDL Cholesterol: 68 mg/dL     Total Cholesterol: 248 mg/dL    Plan for follow up: as needed  Jodelle Red, MD, PhD, [facility] [place]  [facility]    Medication Adjustments/Labs and Tests Ordered: Current medicines are reviewed at length with the patient today.  Concerns regarding medicines are outlined above.  Orders Placed This Encounter  Procedures  . EKG 12-Lead   No orders of the defined types were placed in this encounter.   Patient Instructions  Medication Instructions:  Your  Physician recommend you continue on your current medication as directed.    *If you need a refill on your cardiac medications before your next appointment, please call your pharmacy*   Lab Work: None  Testing/Procedures: None   Follow-Up: At [facility], you and your health needs are our priority.  As part of our continuing mission to provide you with exceptional heart care, we have created designated Provider Care Teams.  These Care Teams include your primary Cardiologist (physician) and Advanced Practice Providers (APPs -  Physician Assistants and Nurse Practitioners) who all work together to provide you with the care you need, when you need it.  We recommend signing up for the patient portal called "MyChart".  Sign up information is provided on this After Visit Summary.  MyChart is used to connect with patients for Virtual Visits (Telemedicine).  Patients are able to view lab/test results, encounter notes, upcoming appointments, etc.  Non-urgent messages can be sent to your provider as well.   To learn more about what you can do with MyChart, go to [email or web address].    Your next appointment:   As needed  The format for your next appointment:   In Person  Provider:   Jodelle Red, MD      Signed, Jodelle Red, MD PhD 09/10/2020 1:20 PM    [facility]

## 2020-09-11 ENCOUNTER — Ambulatory Visit: Payer: 59 | Attending: Internal Medicine

## 2020-09-11 DIAGNOSIS — Z23 Encounter for immunization: Secondary | ICD-10-CM

## 2020-09-11 NOTE — Progress Notes (Signed)
   Covid-19 Vaccination Clinic  Name:  [name]    MRN: [id number] DOB: 04-17-1981  09/11/2020  [name] was observed post Covid-19 immunization for 15 minutes without incident. She was provided with Vaccine Information Sheet and instruction to access the V-Safe system.   [name] was instructed to call 911 with any severe reactions post vaccine: Marland Kitchen Difficulty breathing  . Swelling of face and throat  . A fast heartbeat  . A bad rash all over body  . Dizziness and weakness   Immunizations Administered    Name Date Dose VIS Date Route   Moderna COVID-19 Vaccine 09/11/2020  3:50 PM 0.5 mL 05/21/2020 Intramuscular   Manufacturer: Moderna   Lot: [id number]   NDC: 38453-646-80

## 2020-09-22 ENCOUNTER — Other Ambulatory Visit: Payer: Self-pay

## 2020-09-22 ENCOUNTER — Ambulatory Visit: Payer: 59 | Admitting: Family Medicine

## 2020-09-22 ENCOUNTER — Encounter: Payer: Self-pay | Admitting: Family Medicine

## 2020-09-22 VITALS — BP 130/90 | HR 63 | Wt 204.8 lb

## 2020-09-22 DIAGNOSIS — D509 Iron deficiency anemia, unspecified: Secondary | ICD-10-CM | POA: Diagnosis not present

## 2020-09-22 DIAGNOSIS — K7689 Other specified diseases of liver: Secondary | ICD-10-CM | POA: Insufficient documentation

## 2020-09-22 DIAGNOSIS — R0789 Other chest pain: Secondary | ICD-10-CM

## 2020-09-22 DIAGNOSIS — R809 Proteinuria, unspecified: Secondary | ICD-10-CM | POA: Diagnosis not present

## 2020-09-22 DIAGNOSIS — R41840 Attention and concentration deficit: Secondary | ICD-10-CM

## 2020-09-22 DIAGNOSIS — D75839 Thrombocytosis, unspecified: Secondary | ICD-10-CM

## 2020-09-22 DIAGNOSIS — I10 Essential (primary) hypertension: Secondary | ICD-10-CM

## 2020-09-22 DIAGNOSIS — Z8719 Personal history of other diseases of the digestive system: Secondary | ICD-10-CM | POA: Insufficient documentation

## 2020-09-22 DIAGNOSIS — U099 Post covid-19 condition, unspecified: Secondary | ICD-10-CM | POA: Insufficient documentation

## 2020-09-22 DIAGNOSIS — R9389 Abnormal findings on diagnostic imaging of other specified body structures: Secondary | ICD-10-CM | POA: Insufficient documentation

## 2020-09-22 HISTORY — DX: Other chest pain: R07.89

## 2020-09-22 LAB — POCT URINALYSIS DIP (PROADVANTAGE DEVICE)
Bilirubin, UA: NEGATIVE
Glucose, UA: NEGATIVE mg/dL
Ketones, POC UA: NEGATIVE mg/dL
Leukocytes, UA: NEGATIVE
Nitrite, UA: NEGATIVE
Protein Ur, POC: NEGATIVE mg/dL
Specific Gravity, Urine: 1.025
Urobilinogen, Ur: NEGATIVE
pH, UA: 6 (ref 5.0–8.0)

## 2020-09-22 NOTE — Patient Instructions (Signed)
You will get a call from [place] to GI to schedule.

## 2020-09-22 NOTE — Progress Notes (Signed)
Subjective:    Patient ID: [name], female    DOB: [birth year], 40 y.o.   MRN: [id number]  HPI Chief Complaint  Patient presents with  . Follow-up    Follow-up on HTN   She is here to follow-up on abnormal labs and elevated blood pressure.  Since her last visit she has been evaluated at the post Covid clinic and at her cardiology office.  HTN-she checks her blood pressure at home and her blood pressures have been in the 120 range/80s to 90.  States her diastolic BP is never higher than 90. She has not been on blood pressure medication in 12 to 13 years and prefers not to take it unless absolutely necessary.  Reports eating a low-sodium diet.  States she continues having intermittent fleeting chest wall pain.  Diagnosed with costochondritis.  Taking ibuprofen as needed.  Denies doing any upper body lifting or exertion.  Denies fever, chills, dizziness, chest pain, palpitations, shortness of breath, abdominal pain, N/V/D, urinary symptoms, LE edema.    Anemia- has been taking oral iron x 3 weeks.  She started taking a stool softener with it because of constipation issues. She has a long history of anemia. States her periods are regular since being on birth control.  Denies heavy bleeding.  Does not eat meat. Stopped this after SBO in July 2021.   Unclear etiology for small bowel obstruction in July.  She did not follow-up with GI as recommended by her surgeon.  She denies any GI symptoms but states she is afraid to eat meat due to this problem.  Denies any changes in bowel habits.  Proteinuria-on 2 occasions in July and December.  Needs follow-up Her albumin was also low.  CT of her abdomen done while in the hospital for small bowel obstruction showed incidental finding such as liver nodules and adrenal nodule.   OB/GYN- last there 2 years ago.   3 daughters age 62, 56, 61 year olds Her oldest daughter is in the [employer] and is currently home from [place].   States she is taking off for [place] soon.     Review of Systems Pertinent positives and negatives in the history of present illness.     Objective:   Physical Exam BP 130/90   Pulse 63   Wt 204 lb 12.8 oz (92.9 kg)   BMI 36.28 kg/m   Alert and oriented in no acute distress.  Respirations unlabored.  Normal speech, mood and thought process.      Assessment & Plan:  Iron deficiency anemia, unspecified iron deficiency anemia type - Plan: CBC with Differential/Platelet, Vitamin B12, Iron, TIBC and Ferritin Panel, Folate -Asymptomatic.  Continue oral iron supplement.  She has a long history of anemia but I do suspect that anemia may be worse due to her no longer eating meat since her small bowel obstruction. Check labs and follow-up.  Thrombocytosis - Plan: CBC with Differential/Platelet, Iron, TIBC and Ferritin Panel -We will recheck platelets today.  COVID-19 long hauler manifesting chronic concentration deficit -Seems to be doing slightly better.  She has been evaluated at the post Covid clinic.  Proteinuria, unspecified type - Plan: CBC with Differential/Platelet, Comprehensive metabolic panel, POCT Urinalysis DIP (Proadvantage Device) -Urinalysis dipstick negative for protein today.  It is positive for blood but she reports starting her period yesterday.  Primary hypertension - Plan: CBC with Differential/Platelet, Comprehensive metabolic panel -Continue monitoring blood pressure at home.  Continue with low-sodium diet.  Reviewed labs with patient from  her previous visit with me.  Normal renal function. She is aware that if her blood pressures are higher than 130/80 consistently that we may need to consider starting her back on an antihypertensive medication.  Abnormal finding on CT scan -Discussed that the findings of the adrenal nodule and liver nodules were incidental findings and that we are not ignoring these but I do not feel that we need to urgently follow-up with  scanning today.  Consider this in the future however.  History of small bowel obstruction - Plan: Ambulatory referral to Gastroenterology -Currently asymptomatic.  Unknown explanation for SBO in the past.  Did not follow-up with GI as recommended.  I will refer her today.  Liver nodule - Plan: Ambulatory referral to Gastroenterology -Presumed benign liver nodules.  She did not follow-up with GI after her small bowel obstruction.  I am referring her  Costochondritis-evaluated by cardiology.  She continues having fleeting burning sensations and tenderness of her chest wall.  Continue ibuprofen as needed.  Discussed avoiding exertional activity with her upper body.

## 2020-09-23 LAB — COMPREHENSIVE METABOLIC PANEL
ALT: 10 IU/L (ref 0–32)
AST: 12 IU/L (ref 0–40)
Albumin/Globulin Ratio: 1.1 — ABNORMAL LOW (ref 1.2–2.2)
Albumin: 3.6 g/dL — ABNORMAL LOW (ref 3.8–4.8)
Alkaline Phosphatase: 62 IU/L (ref 44–121)
BUN/Creatinine Ratio: 14 (ref 9–23)
BUN: 9 mg/dL (ref 6–24)
Bilirubin Total: 0.2 mg/dL (ref 0.0–1.2)
CO2: 20 mmol/L (ref 20–29)
Calcium: 9.1 mg/dL (ref 8.7–10.2)
Chloride: 103 mmol/L (ref 96–106)
Creatinine, Ser: 0.64 mg/dL (ref 0.57–1.00)
GFR calc Af Amer: 129 mL/min/{1.73_m2} (ref >59)
GFR calc non Af Amer: 112 mL/min/{1.73_m2} (ref >59)
Globulin, Total: 3.3 g/dL (ref 1.5–4.5)
Glucose: 75 mg/dL (ref 65–99)
Potassium: 4.2 mmol/L (ref 3.5–5.2)
Sodium: 137 mmol/L (ref 134–144)
Total Protein: 6.9 g/dL (ref 6.0–8.5)

## 2020-09-23 LAB — IRON,TIBC AND FERRITIN PANEL
Ferritin: 9 ng/mL — ABNORMAL LOW (ref 15–150)
Iron Saturation: 41 % (ref 15–55)
Iron: 174 ug/dL — ABNORMAL HIGH (ref 27–159)
Total Iron Binding Capacity: 421 ug/dL (ref 250–450)
UIBC: 247 ug/dL (ref 131–425)

## 2020-09-23 LAB — CBC WITH DIFFERENTIAL/PLATELET
Basophils Absolute: 0.1 10*3/uL (ref 0.0–0.2)
Basos: 2 %
EOS (ABSOLUTE): 0.1 10*3/uL (ref 0.0–0.4)
Eos: 3 %
Hematocrit: 30.9 % — ABNORMAL LOW (ref 34.0–46.6)
Hemoglobin: 9.6 g/dL — ABNORMAL LOW (ref 11.1–15.9)
Immature Grans (Abs): 0 10*3/uL (ref 0.0–0.1)
Immature Granulocytes: 0 %
Lymphocytes Absolute: 1.1 10*3/uL (ref 0.7–3.1)
Lymphs: 34 %
MCH: 24.9 pg — ABNORMAL LOW (ref 26.6–33.0)
MCHC: 31.1 g/dL — ABNORMAL LOW (ref 31.5–35.7)
MCV: 80 fL (ref 79–97)
Monocytes Absolute: 0.5 10*3/uL (ref 0.1–0.9)
Monocytes: 16 %
Neutrophils Absolute: 1.5 10*3/uL (ref 1.4–7.0)
Neutrophils: 45 %
Platelets: 465 10*3/uL — ABNORMAL HIGH (ref 150–450)
RBC: 3.86 x10E6/uL (ref 3.77–5.28)
RDW: 17.4 % — ABNORMAL HIGH (ref 11.7–15.4)
WBC: 3.2 10*3/uL — ABNORMAL LOW (ref 3.4–10.8)

## 2020-09-23 LAB — VITAMIN B12: Vitamin B-12: 824 pg/mL (ref 232–1245)

## 2020-09-23 LAB — FOLATE: Folate: 12.1 ng/mL (ref >3.0)

## 2020-09-25 NOTE — Progress Notes (Signed)
Please call her since she has not responded to my Mychart message.

## 2020-09-26 NOTE — Progress Notes (Signed)
That is fine but her numbers have been consistent over the past 2 years per review of her record.

## 2021-01-14 ENCOUNTER — Telehealth: Payer: Self-pay | Admitting: Family Medicine

## 2021-01-14 ENCOUNTER — Ambulatory Visit: Payer: 59 | Admitting: Family Medicine

## 2021-01-14 NOTE — Telephone Encounter (Signed)
Pt was advised per [name] that she would need appointment that we can not advise treatment without seeing her. Patient states she will call back and schedule an appointment

## 2021-01-14 NOTE — Telephone Encounter (Signed)
Patient has question about what she can do about eye irritation. Pt was advised, not sure what information could be given without an evaluation

## 2021-04-11 ENCOUNTER — Other Ambulatory Visit: Payer: Self-pay | Admitting: Advanced Practice Midwife

## 2021-04-13 ENCOUNTER — Other Ambulatory Visit: Payer: Self-pay

## 2021-04-13 MED ORDER — NORGESTIM-ETH ESTRAD TRIPHASIC 0.18/0.215/0.25 MG-25 MCG PO TABS: 1.0000 | ORAL_TABLET | Freq: Every day | ORAL | 2 refills | Status: DC

## 2021-04-13 NOTE — Telephone Encounter (Signed)
Patient called about her refill [employer] sent in. She said she was suppose to start them yesterday.

## 2021-05-07 ENCOUNTER — Other Ambulatory Visit: Payer: Self-pay

## 2021-05-07 ENCOUNTER — Other Ambulatory Visit (HOSPITAL_COMMUNITY)
Admission: RE | Admit: 2021-05-07 | Discharge: 2021-05-07 | Disposition: A | Discharge status: Home / Self Care | Payer: 59 | Source: Ambulatory Visit | Attending: Advanced Practice Midwife | Admitting: Advanced Practice Midwife

## 2021-05-07 ENCOUNTER — Encounter: Payer: Self-pay | Admitting: Advanced Practice Midwife

## 2021-05-07 ENCOUNTER — Ambulatory Visit (INDEPENDENT_AMBULATORY_CARE_PROVIDER_SITE_OTHER): Payer: 59 | Admitting: Advanced Practice Midwife

## 2021-05-07 VITALS — BP 144/95 | HR 65 | Ht 63.0 in | Wt 198.0 lb

## 2021-05-07 DIAGNOSIS — Z01419 Encounter for gynecological examination (general) (routine) without abnormal findings: Secondary | ICD-10-CM | POA: Insufficient documentation

## 2021-05-07 DIAGNOSIS — Z113 Encounter for screening for infections with a predominantly sexual mode of transmission: Secondary | ICD-10-CM | POA: Diagnosis not present

## 2021-05-07 MED ORDER — NORGESTIM-ETH ESTRAD TRIPHASIC 0.18/0.215/0.25 MG-25 MCG PO TABS: 1.0000 | ORAL_TABLET | Freq: Every day | ORAL | 2 refills | Status: DC

## 2021-05-07 MED ORDER — AMLODIPINE BESYLATE 5 MG PO TABS: 5.0000 mg | ORAL_TABLET | Freq: Every day | ORAL | 0 refills | Status: DC

## 2021-05-07 NOTE — Progress Notes (Signed)
[name] 40 y.o.  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   Hypertension    Thrombocytosis 08/21/2020    Past Surgical History: Past Surgical History:  Procedure Laterality Date   SPLENECTOMY, TOTAL      Family History: Family History  Problem Relation Age of Onset   Diabetes Mother    Hypertension Mother    Diabetes Father    Hypertension Father    Asthma Daughter     Social History: Social History   Tobacco Use   Smoking status: Never   Smokeless tobacco: Never  Vaping Use   Vaping Use: Never used  Substance Use Topics   Alcohol use: No    Alcohol/week: 0.0 standard drinks   Drug use: No    Allergies: No Known Allergies    Current Outpatient Medications:    amLODipine (NORVASC) 5 MG tablet, Take 1 tablet (5 mg total) by mouth daily., Disp: 90 tablet, Rfl: 0   Ferrous Sulfate (IRON PO), Take by mouth., Disp: , Rfl:    Multiple Vitamin (MULTIVITAMIN WITH MINERALS) TABS tablet, Take 1 tablet by mouth daily., Disp: , Rfl:    Norgestimate-Ethinyl Estradiol Triphasic (TRI-LO-SPRINTEC) 0.18/0.215/0.25 MG-25 MCG tab, Take 1 tablet by mouth daily., Disp: 84 tablet, Rfl: 2  History of Present Illness: Here for pap and physical .Last pap 3 years ago, normal. On COCs for [facility], has know HTN, hasn't needed meds in years.     Review of Systems   Patient denies any headaches, blurred vision, shortness of breath, chest pain, abdominal pain, problems with bowel movements, urination, or intercourse.   Physical Exam: General:  Well developed, well nourished, no acute distress Skin:  Warm and dry Neck:  Midline trachea, normal thyroid Lungs; Clear to auscultation bilaterally Breast:  No dominant palpable mass, retraction, or nipple  discharge Cardiovascular: Regular rate and rhythm Abdomen:  Soft, non tender, no hepatosplenomegaly Pelvic:  External genitalia is normal in appearance.  The vagina is normal in appearance.  The cervix is bulbous.  Uterus is felt to be normal size, shape, and contour.  No adnexal masses or tenderness noted.  Extremities:  No swelling or varicosities noted Psych:  No mood changes.     Impression: Normal GYN exam CHTN     Plan: If pap normal, repeat in 3 years.  Start Norvasc 5mg --message to be sent in one week prompting for BP results (pt has home BP cuff).  Plan PCP visit in the next few months to take over care

## 2021-05-07 NOTE — Patient Instructions (Signed)
Call [phone] to schedule mammogram °

## 2021-05-11 LAB — CYTOLOGY - PAP
Adequacy: ABSENT
Chlamydia: NEGATIVE
Comment: NEGATIVE
Comment: NEGATIVE
Comment: NORMAL
Diagnosis: NEGATIVE
High risk HPV: NEGATIVE
Neisseria Gonorrhea: NEGATIVE

## 2021-10-22 ENCOUNTER — Other Ambulatory Visit: Payer: Self-pay | Admitting: Advanced Practice Midwife

## 2022-02-07 ENCOUNTER — Other Ambulatory Visit: Payer: Self-pay

## 2022-02-07 ENCOUNTER — Emergency Department (HOSPITAL_COMMUNITY)
Admission: EM | Admit: 2022-02-07 | Discharge: 2022-02-07 | Disposition: A | Discharge status: Home / Self Care | Payer: 59 | Attending: Emergency Medicine | Admitting: Emergency Medicine

## 2022-02-07 ENCOUNTER — Encounter (HOSPITAL_COMMUNITY): Payer: Self-pay

## 2022-02-07 DIAGNOSIS — Z79899 Other long term (current) drug therapy: Secondary | ICD-10-CM | POA: Diagnosis not present

## 2022-02-07 DIAGNOSIS — H5330 Unspecified disorder of binocular vision: Secondary | ICD-10-CM | POA: Insufficient documentation

## 2022-02-07 DIAGNOSIS — H538 Other visual disturbances: Secondary | ICD-10-CM | POA: Diagnosis present

## 2022-02-07 DIAGNOSIS — I10 Essential (primary) hypertension: Secondary | ICD-10-CM | POA: Diagnosis not present

## 2022-02-07 LAB — CBC WITH DIFFERENTIAL/PLATELET
Abs Immature Granulocytes: 0.01 10*3/uL (ref 0.00–0.07)
Basophils Absolute: 0 10*3/uL (ref 0.0–0.1)
Basophils Relative: 1 %
Eosinophils Absolute: 0 10*3/uL (ref 0.0–0.5)
Eosinophils Relative: 1 %
HCT: 35.5 % — ABNORMAL LOW (ref 36.0–46.0)
Hemoglobin: 11.4 g/dL — ABNORMAL LOW (ref 12.0–15.0)
Immature Granulocytes: 0 %
Lymphocytes Relative: 31 %
Lymphs Abs: 1.2 10*3/uL (ref 0.7–4.0)
MCH: 29.8 pg (ref 26.0–34.0)
MCHC: 32.1 g/dL (ref 30.0–36.0)
MCV: 92.9 fL (ref 80.0–100.0)
Monocytes Absolute: 0.4 10*3/uL (ref 0.1–1.0)
Monocytes Relative: 11 %
Neutro Abs: 2.1 10*3/uL (ref 1.7–7.7)
Neutrophils Relative %: 56 %
Platelets: 372 10*3/uL (ref 150–400)
RBC: 3.82 MIL/uL — ABNORMAL LOW (ref 3.87–5.11)
RDW: 14.3 % (ref 11.5–15.5)
WBC: 3.8 10*3/uL — ABNORMAL LOW (ref 4.0–10.5)
nRBC: 0 % (ref 0.0–0.2)

## 2022-02-07 LAB — BASIC METABOLIC PANEL
Anion gap: 5 (ref 5–15)
BUN: 7 mg/dL (ref 6–20)
CO2: 26 mmol/L (ref 22–32)
Calcium: 8.7 mg/dL — ABNORMAL LOW (ref 8.9–10.3)
Chloride: 105 mmol/L (ref 98–111)
Creatinine, Ser: 0.81 mg/dL (ref 0.44–1.00)
GFR, Estimated: >60 mL/min (ref >60)
Glucose, Bld: 92 mg/dL (ref 70–99)
Potassium: 4 mmol/L (ref 3.5–5.1)
Sodium: 136 mmol/L (ref 135–145)

## 2022-02-07 LAB — HCG, QUANTITATIVE, PREGNANCY: hCG, Beta Chain, Quant, S: <1 m[IU]/mL (ref <5)

## 2022-02-07 MED ORDER — TETRACAINE HCL 0.5 % OP SOLN
2.0000 [drp] | Freq: Once | OPHTHALMIC | Status: DC
  Filled 2022-02-07: qty 4

## 2022-02-07 NOTE — ED Provider Notes (Signed)
[facility] EMERGENCY DEPARTMENT Provider Note   CSN: [id number] Arrival date & time: 02/07/22  1536     History  Chief Complaint  Patient presents with   Blurred Vision    [name] is a 41 y.o. female with a history including hypertension, SBO, traumatic splenectomy and history of a labor induced subarachnoid hemorrhage in 2009 presenting for evaluation of blurred vision which started around 12:00 today when she was sitting at church.  She describes difficulty focusing her eyes and had significant blurred vision while at church.  She continues to have mild blurred vision but improved currently.  She states when she uses each eye individually, its not that bad, but sx are worsened when she uses both eyes together.  She denies double vision.   This is not associated with headache, head injury, denies nausea or vomiting.  She does have glasses for distance vision but rarely uses them.  She denies dizziness currently but does have a history of vertigo. She has no weakness in her extremities.  Denies migraine history.  The history is provided by the patient.       Home Medications Prior to Admission medications   Medication Sig Start Date End Date Taking? Authorizing Provider  Ferrous Sulfate (IRON PO) Take by mouth.   Yes [provider]  Multiple Vitamin (MULTIVITAMIN WITH MINERALS) TABS tablet Take 1 tablet by mouth daily.   Yes [provider]  TRI-LO-SPRINTEC 0.18/0.215/0.25 MG-25 MCG tab TAKE ONE TABLET BY MOUTH ONCE DAILY. Patient taking differently: Take 1 tablet by mouth daily. 10/22/21  Yes Cresenzo-Dishmon, Scarlette Calico, CNM  amLODipine (NORVASC) 5 MG tablet Take 1 tablet (5 mg total) by mouth daily. Patient not taking: Reported on 02/07/2022 05/07/21   Jacklyn Shell, CNM      Allergies    Patient has no known allergies.    Review of Systems   Review of Systems  Constitutional:  Negative for fever.  HENT:  Negative for congestion and  sore throat.   Eyes:  Positive for visual disturbance. Negative for photophobia and pain.  Respiratory:  Negative for chest tightness and shortness of breath.   Cardiovascular:  Negative for chest pain.  Gastrointestinal:  Negative for abdominal pain and nausea.  Genitourinary: Negative.   Musculoskeletal:  Negative for arthralgias, joint swelling and neck pain.  Skin: Negative.  Negative for rash and wound.  Neurological:  Negative for dizziness, syncope, weakness, light-headedness, numbness and headaches.  Psychiatric/Behavioral: Negative.      Physical Exam Updated Vital Signs BP (!) 149/109 (BP Location: Left Arm)   Pulse 78   Temp 98.2 F (36.8 C) (Oral)   Resp 15   Ht 5\' 3"  (1.6 m)   Wt 90.3 kg   LMP 01/19/2022   SpO2 100%   BMI 35.25 kg/m  Physical Exam Vitals and nursing note reviewed.  Constitutional:      Appearance: She is well-developed.  HENT:     Head: Normocephalic and atraumatic.  Eyes:     General: Vision grossly intact. Gaze aligned appropriately. No visual field deficit.    Extraocular Movements: Extraocular movements intact.     Conjunctiva/sclera: Conjunctivae normal.     Pupils: Pupils are equal, round, and reactive to light.     Slit lamp exam:    Right eye: Anterior chamber quiet.     Left eye: Anterior chamber quiet.     Comments: Pin hole test positive, pt vision 20/20 OS/OD,  20/25 OU.   Cardiovascular:  Rate and Rhythm: Normal rate and regular rhythm.     Heart sounds: Normal heart sounds.  Pulmonary:     Effort: Pulmonary effort is normal.     Breath sounds: Normal breath sounds. No wheezing.  Abdominal:     General: Bowel sounds are normal.     Palpations: Abdomen is soft.     Tenderness: There is no abdominal tenderness.  Musculoskeletal:        General: Normal range of motion.     Cervical back: Normal range of motion.  Skin:    General: Skin is warm and dry.  Neurological:     General: No focal deficit present.     Mental  Status: She is alert and oriented to person, place, and time.     Cranial Nerves: No cranial nerve deficit.     Sensory: Sensation is intact.     Motor: Motor function is intact. No pronator drift.     Coordination: Heel to [facility] Test normal. Rapid alternating movements normal.     Comments: Equal grip strength, negative pronator drift.     ED Results / Procedures / Treatments   Labs (all labs ordered are listed, but only abnormal results are displayed) Labs Reviewed  CBC WITH DIFFERENTIAL/PLATELET - Abnormal; Notable for the following components:      Result Value   WBC 3.8 (*)    RBC 3.82 (*)    Hemoglobin 11.4 (*)    HCT 35.5 (*)    All other components within normal limits  BASIC METABOLIC PANEL - Abnormal; Notable for the following components:   Calcium 8.7 (*)    All other components within normal limits  HCG, QUANTITATIVE, PREGNANCY    EKG None  Radiology No results found.  Procedures Procedures    Medications Ordered in ED Medications  tetracaine (PONTOCAINE) 0.5 % ophthalmic solution 2 drop (has no administration in time range)    ED Course/ Medical Decision Making/ A&P                           Medical Decision Making Patient with binocular blurred vision which clears upon closing either eye, she endorses her right eye seems worse than the left.  She has no significant exam findings except she does have a positive pinhole test, even with this testing her vision is fairly acceptable, worse vision binocular 20/25.  She has no other neurologic deficits, no headache.  This does not appear to be a central intracranial problem but appears specific to her vision.  Patient was discussed with Dr. Zenaida Niece of ophthalmology.  She will see this patient tomorrow in her clinic, patient will call for an appointment time.  Patient does not have her glasses with her currently, she confirms however that she is supposed to wear them for driving only and not for vision.  She states she  does not wear these glasses very often however.  I have asked her to make sure she takes these with her to her appointment tomorrow.  Amount and/or Complexity of Data Reviewed Discussion of management or test interpretation with external provider(s): Discussed with Dr. Zenaida Niece of ophthalmology.           Final Clinical Impression(s) / ED Diagnoses Final diagnoses:  Disorder of binocular vision    Rx / DC Orders ED Discharge Orders     None         Victoriano Lain 02/07/22 1840    Nanavati,  Ankit, MD 02/08/22 1511

## 2022-02-07 NOTE — ED Triage Notes (Signed)
Pt presents to ED with complaints of blurry vision in both eyes that started today about 1200. Pt states she is having a hard time concentrating on things. Pt now states "my eyes aren't blurry but it's not normal" When checking visual acuity, pt states was a little blurry.

## 2022-02-07 NOTE — ED Notes (Signed)
Pt states she has a Hx of vertigo

## 2022-05-11 IMAGING — DX DG CHEST 1V PORT
1 series · 1 of 1 positions shown · non-contrast
Comparison: Chest radiograph dated 02/11/2020.

CLINICAL DATA: 39-year-old female with cough.

EXAM:
PORTABLE CHEST 1 VIEW

[chest ap]
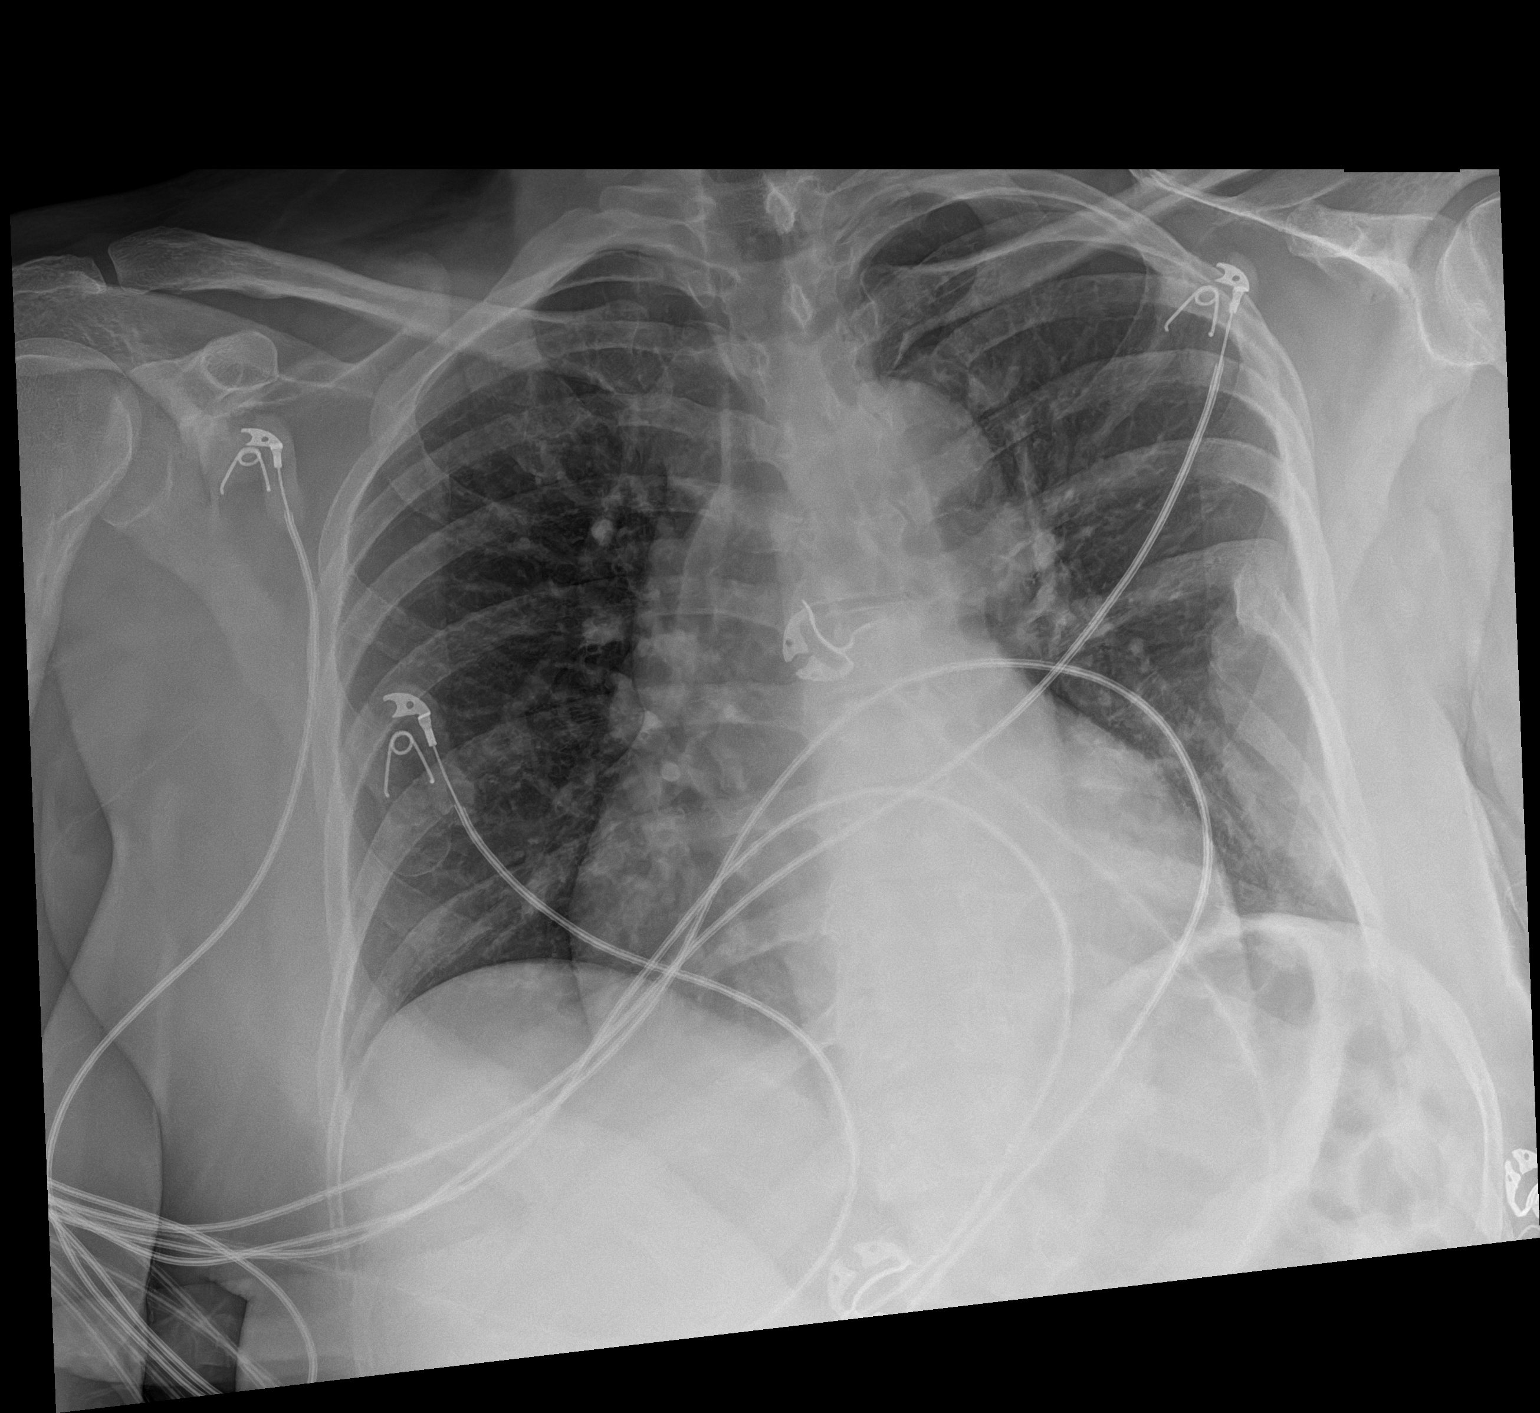

[1 of 1 positions shown; findings below may reference images not displayed]

FINDINGS: No focal consolidation, pleural effusion, pneumothorax. Mild
cardiomegaly. No acute osseous pathology. Old healed left posterior
rib fractures.
IMPRESSION: No active disease.

## 2022-06-16 ENCOUNTER — Encounter: Payer: Self-pay | Admitting: Internal Medicine

## 2022-07-15 ENCOUNTER — Encounter: Payer: Self-pay | Admitting: Nurse Practitioner

## 2022-07-15 ENCOUNTER — Ambulatory Visit: Payer: 59 | Admitting: Nurse Practitioner

## 2022-07-15 VITALS — BP 128/82 | HR 63 | Temp 98.5°F | Ht 62.25 in | Wt 200.2 lb

## 2022-07-15 DIAGNOSIS — Q8901 Asplenia (congenital): Secondary | ICD-10-CM

## 2022-07-15 DIAGNOSIS — I10 Essential (primary) hypertension: Secondary | ICD-10-CM

## 2022-07-15 DIAGNOSIS — Z23 Encounter for immunization: Secondary | ICD-10-CM

## 2022-07-15 DIAGNOSIS — Z0000 Encounter for general adult medical examination without abnormal findings: Secondary | ICD-10-CM | POA: Diagnosis not present

## 2022-07-15 MED ORDER — NORGESTIM-ETH ESTRAD TRIPHASIC 0.18/0.215/0.25 MG-25 MCG PO TABS: 1.0000 | ORAL_TABLET | Freq: Every day | ORAL | 3 refills | Status: DC

## 2022-07-15 NOTE — Patient Instructions (Signed)
It was a pleasure to meet you today!! I will let you know what your labs show.

## 2022-07-15 NOTE — Progress Notes (Signed)
Worthy Keeler, DNP, AGNP-c [place], [place] [address] Main Office [phone]  BP 128/82   Pulse 63   Temp 98.5 F (36.9 C)   Ht 5' 2.25" (1.581 m)   Wt 200 lb 3.2 oz (90.8 kg)   BMI 36.32 kg/m    Subjective:    Patient ID: [name], female    DOB: March 04, 1981, 41 y.o.   MRN: [id number]  HPI: [name] is a 41 y.o. female presenting on 07/15/2022 for comprehensive medical examination.   Current medical concerns include:none  She reports regular vision exams q1-5y: Yes  She reports regular dental exams q 58m  Yes  The patient eats a regular, healthy diet. She endorses exercise and/or activity of: regular/routine  She endorses the following: Marital Status: married Living situation: with family  She denies concerns with STI today, testing was not ordered  A comprehensive review of systems was negative.  Most Recent Depression Screen:     07/15/2022    2:31 PM 05/07/2021    3:15 PM 08/25/2020   12:56 PM  Depression screen PHQ 2/9  Decreased Interest 0 0 0  Down, Depressed, Hopeless 0 0 0  PHQ - 2 Score 0 0 0  Altered sleeping  0   Tired, decreased energy  0   Change in appetite  0   Feeling bad or failure about yourself   0   Trouble concentrating  0   Moving slowly or fidgety/restless  0   Suicidal thoughts  0   PHQ-9 Score  0    Most Recent Anxiety Screen:     05/07/2021    3:15 PM  GAD 7 : Generalized Anxiety Score  Nervous, Anxious, on Edge 0  Control/stop worrying 0  Worry too much - different things 0  Trouble relaxing 0  Restless 0  Easily annoyed or irritable 0  Afraid - awful might happen 0  Total GAD 7 Score 0   Most Recent Fall Screen:    07/15/2022    2:31 PM 05/07/2021    3:14 PM 08/25/2020   12:56 PM 11/20/2018    9:04 AM  Fall Risk   Falls in the past year? 0 1 0 0  Number falls in past yr: 0 0 0 0  Injury with Fall? 0 0 0 0  Risk for fall due to : No Fall  Risks No Fall Risks    Follow up Falls evaluation completed       Past medical history, surgical history, medications, allergies, family history and social history reviewed with patient today and changes made to appropriate areas of the chart.  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   Multiple Vitamin (MULTIVITAMIN WITH MINERALS) TABS tablet Take 1 tablet by mouth daily.   Omega-3 Fatty Acids (FISH OIL) 500 MG CAPS Take 1 capsule by mouth daily.   No current facility-administered medications on file prior to visit.   Surgical History:  Past Surgical History:  Procedure Laterality Date   SPLENECTOMY, TOTAL     Allergies:  No Known Allergies Family History:  Family History  Problem Relation Age of Onset   Diabetes Mother    Hypertension  Mother    Diabetes Father    Hypertension Father    Asthma Daughter        Objective:    BP 128/82   Pulse 63   Temp 98.5 F (36.9 C)   Ht 5' 2.25" (1.581 m)   Wt 200 lb 3.2 oz (90.8 kg)   BMI 36.32 kg/m   Wt Readings from Last 3 Encounters:  07/15/22 200 lb 3.2 oz (90.8 kg)  02/07/22 199 lb (90.3 kg)  05/07/21 198 lb (89.8 kg)    Physical Exam Vitals and nursing note reviewed.  Constitutional:      General: She is not in acute distress.    Appearance: Normal appearance.  HENT:     Head: Normocephalic and atraumatic.     Right Ear: Hearing, tympanic membrane, ear canal and external ear normal.     Left Ear: Hearing, tympanic membrane, ear canal and external ear normal.     Nose: Nose normal.     Right Sinus: No maxillary sinus tenderness or frontal sinus tenderness.     Left Sinus: No maxillary sinus tenderness or frontal sinus tenderness.      Mouth/Throat:     Lips: Pink.     Mouth: Mucous membranes are moist.     Pharynx: Oropharynx is clear.  Eyes:     General: Lids are normal. Vision grossly intact.     Extraocular Movements: Extraocular movements intact.     Conjunctiva/sclera: Conjunctivae normal.     Pupils: Pupils are equal, round, and reactive to light.     Funduscopic exam:    Right eye: Red reflex present.        Left eye: Red reflex present.    Visual Fields: Right eye visual fields normal and left eye visual fields normal.  Neck:     Thyroid: No thyromegaly.     Vascular: No carotid bruit.  Cardiovascular:     Rate and Rhythm: Normal rate and regular rhythm.     Chest Wall: PMI is not displaced.     Pulses: Normal pulses.          Dorsalis pedis pulses are 2+ on the right side and 2+ on the left side.       Posterior tibial pulses are 2+ on the right side and 2+ on the left side.     Heart sounds: Normal heart sounds. No murmur heard. Pulmonary:     Effort: Pulmonary effort is normal. No respiratory distress.     Breath sounds: Normal breath sounds.  Abdominal:     General: Abdomen is flat. Bowel sounds are normal. There is no distension.     Palpations: Abdomen is soft. There is no hepatomegaly, splenomegaly or mass.     Tenderness: There is no abdominal tenderness. There is no right CVA tenderness, left CVA tenderness, guarding or rebound.  Musculoskeletal:        General: Normal range of motion.     Cervical back: Full passive range of motion without pain, normal range of motion and neck supple. No tenderness.     Right lower leg: No edema.     Left lower leg: No edema.  Feet:     Left foot:     Toenail Condition: Left toenails are normal.  Lymphadenopathy:     Cervical: No cervical adenopathy.     Upper Body:     Right upper body: No supraclavicular adenopathy.     Left upper body: No supraclavicular adenopathy.  Skin:  General: Skin is warm and dry.     Capillary Refill: Capillary refill  takes less than 2 seconds.     Nails: There is no clubbing.  Neurological:     General: No focal deficit present.     Mental Status: She is alert and oriented to person, place, and time.     GCS: GCS eye subscore is 4. GCS verbal subscore is 5. GCS motor subscore is 6.     Sensory: Sensation is intact.     Motor: Motor function is intact.     Coordination: Coordination is intact.     Gait: Gait is intact.     Deep Tendon Reflexes: Reflexes are normal and symmetric.  Psychiatric:        Attention and Perception: Attention normal.        Mood and Affect: Mood normal.        Speech: Speech normal.        Behavior: Behavior normal. Behavior is cooperative.        Thought Content: Thought content normal.        Cognition and Memory: Cognition and memory normal.        Judgment: Judgment normal.     Results for orders placed or performed in visit on 07/15/22  CBC with Differential/Platelet  Result Value Ref Range   WBC 4.0 3.4 - 10.8 x10E3/uL   RBC 3.88 3.77 - 5.28 x10E6/uL   Hemoglobin 11.6 11.1 - 15.9 g/dL   Hematocrit 35.7 34.0 - 46.6 %   MCV 92 79 - 97 fL   MCH 29.9 26.6 - 33.0 pg   MCHC 32.5 31.5 - 35.7 g/dL   RDW 13.1 11.7 - 15.4 %   Platelets 375 150 - 450 x10E3/uL   Neutrophils 46 Not Estab. %   Lymphs 38 Not Estab. %   Monocytes 13 Not Estab. %   Eos 2 Not Estab. %   Basos 1 Not Estab. %   Neutrophils Absolute 1.9 1.4 - 7.0 x10E3/uL   Lymphocytes Absolute 1.5 0.7 - 3.1 x10E3/uL   Monocytes Absolute 0.5 0.1 - 0.9 x10E3/uL   EOS (ABSOLUTE) 0.1 0.0 - 0.4 x10E3/uL   Basophils Absolute 0.0 0.0 - 0.2 x10E3/uL   Immature Granulocytes 0 Not Estab. %   Immature Grans (Abs) 0.0 0.0 - 0.1 x10E3/uL  Comprehensive metabolic panel  Result Value Ref Range   Glucose 72 70 - 99 mg/dL   BUN 8 6 - 24 mg/dL   Creatinine, Ser 0.69 0.57 - 1.00 mg/dL   eGFR 111 >59 mL/min/1.73   BUN/Creatinine Ratio 12 9 - 23   Sodium 138 134 - 144 mmol/L   Potassium 4.3 3.5 - 5.2 mmol/L   Chloride  101 96 - 106 mmol/L   CO2 21 20 - 29 mmol/L   Calcium 9.0 8.7 - 10.2 mg/dL   Total Protein 6.8 6.0 - 8.5 g/dL   Albumin 3.8 (L) 3.9 - 4.9 g/dL   Globulin, Total 3.0 1.5 - 4.5 g/dL   Albumin/Globulin Ratio 1.3 1.2 - 2.2   Bilirubin Total 0.4 0.0 - 1.2 mg/dL   Alkaline Phosphatase 68 44 - 121 IU/L   AST 17 0 - 40 IU/L   ALT 13 0 - 32 IU/L  Hemoglobin A1c  Result Value Ref Range   Hgb A1c MFr Bld 5.2 4.8 - 5.6 %   Est. average glucose Bld gHb Est-mCnc 103 mg/dL  VITAMIN D 25 Hydroxy (Vit-D Deficiency, Fractures)  Result Value Ref Range  Vit D, 25-Hydroxy 31.7 30.0 - 100.0 ng/mL  TSH  Result Value Ref Range   TSH 1.150 0.450 - 4.500 uIU/mL  T4, free  Result Value Ref Range   Free T4 1.27 0.82 - 1.77 ng/dL    IMMUNIZATIONS:   Flu: Flu vaccine completed elsewhere this season Prevnar 13: Prevnar 13 N/A for this patient Pneumovax 23: Pneumovax 23 N/A for this patient Vac Shingrix: Shingrix N/A for this patient HPV: HPV N/A for this patient Tetanus: Tetanus completed in the last 10 years  HEALTH MAINTENANCE: Pap Smear HM Status: is up to date Mammogram HM Status: is up to date Colon Cancer Screening HM Status: is not applicable for this patient Bone Density HM Status: is not applicable for this patient STI Testing HM Status: is not applicable for this patient  Eye Exam HM Status: is up to date Urine Micro HM Status: is not applicable for this patient  Spirometry HM Status: is not applicable for this patient      Assessment & Plan:   Problem List Items Addressed This Visit     Hypertension    Chronic. Well controlled. No alarm sx. No current medications. Will monitor.       Encounter for annual physical exam - Primary   Relevant Orders   CBC with Differential/Platelet (Completed)   Comprehensive metabolic panel (Completed)   Hemoglobin A1c (Completed)   VITAMIN D 25 Hydroxy (Vit-D Deficiency, Fractures) (Completed)   TSH (Completed)   T4, free (Completed)   Flu  Vaccine QUAD 6+ mos PF IM (Fluarix Quad PF) (Completed)   Other Visit Diagnoses     Asplenia       Relevant Orders   CBC with Differential/Platelet (Completed)   Comprehensive metabolic panel (Completed)   Hemoglobin A1c (Completed)   VITAMIN D 25 Hydroxy (Vit-D Deficiency, Fractures) (Completed)   TSH (Completed)   T4, free (Completed)   Health care maintenance       Relevant Medications   Norgestimate-Ethinyl Estradiol Triphasic (TRI-LO-SPRINTEC) 0.18/0.215/0.25 MG-25 MCG tab   Other Relevant Orders   CBC with Differential/Platelet (Completed)   Comprehensive metabolic panel (Completed)   Hemoglobin A1c (Completed)   VITAMIN D 25 Hydroxy (Vit-D Deficiency, Fractures) (Completed)   TSH (Completed)   T4, free (Completed)          Follow up plan: No follow-ups on file.  NEXT PREVENTATIVE PHYSICAL DUE IN 1 YEAR.  PATIENT COUNSELING PROVIDED FOR ALL ADULT PATIENTS:  Consume a well balanced diet low in saturated fats, cholesterol, and moderation in carbohydrates.   This can be as simple as monitoring portion sizes and cutting back on sugary beverages such as soda and juice to start with.    Daily water consumption of at least 64 ounces.  Physical activity at least 180 minutes per week, if just starting out.   This can be as simple as taking the stairs instead of the elevator and walking 2-3 laps around the office  purposefully every day.   STD protection, partner selection, and regular testing if high risk.  Limited consumption of alcoholic beverages if alcohol is consumed.  For women, I recommend no more than 7 alcoholic beverages per week, spread out throughout the week.  Avoid "binge" drinking or consuming large quantities of alcohol in one setting.   Please let me know if you feel you may need help with reduction or quitting alcohol consumption.   Avoidance of nicotine, if used.  Please let me know if you feel you may need  help with reduction or quitting nicotine  use.   Daily mental health attention.  This can be in the form of 5 minute daily meditation, prayer, journaling, yoga, reflection, etc.   Purposeful attention to your emotions and mental state can significantly improve your overall wellbeing and Health.  Please know that I am here to help you with all of your health care goals and am happy to work with you to find a solution that works best for you.  The greatest advice I have received with any changes in life are to take it one step at a time, that even means if all you can focus on is the next 60 seconds, then do that and celebrate your victories.  With any changes in life, you will have set backs, and that is OK. The important thing to remember is, if you have a set back, it is not a failure, it is an opportunity to try again!  Health Maintenance Recommendations Screening Testing Mammogram Every 1 -2 years based on history and risk factors Starting at age 19 Pap Smear Ages 21-39 every 3 years Ages 34-65 every 5 years with HPV testing More frequent testing may be required based on results and history Colon Cancer Screening Every 1-10 years based on test performed, risk factors, and history Starting at age 59 Bone Density Screening Every 2-10 years based on history Starting at age 6 for women Recommendations for men differ based on medication usage, history, and risk factors AAA Screening One time ultrasound Men 24-49 years old who have every smoked Lung Cancer Screening Low Dose Lung CT every 12 months Age 90-80 years with a 30 pack-year smoking history who still smoke or who have quit within the last 15 years  Screening Labs Routine  Labs: Complete Blood Count (CBC), Complete Metabolic Panel (CMP), Cholesterol (Lipid Panel) Every 6-12 months based on history and medications May be recommended more frequently based on current conditions or previous results Hemoglobin A1c Lab Every 3-12 months based on history and previous  results Starting at age 86 or earlier with diagnosis of diabetes, high cholesterol, BMI >26, and/or risk factors Frequent monitoring for patients with diabetes to ensure blood sugar control Thyroid Panel (TSH w/ T3 & T4) Every 6 months based on history, symptoms, and risk factors May be repeated more often if on medication HIV One time testing for all patients 62 and older May be repeated more frequently for patients with increased risk factors or exposure Hepatitis C One time testing for all patients 9 and older May be repeated more frequently for patients with increased risk factors or exposure Gonorrhea, Chlamydia Every 12 months for all sexually active persons 13-24 years Additional monitoring may be recommended for those who are considered high risk or who have symptoms PSA Men 51-23 years old with risk factors Additional screening may be recommended from age 65-69 based on risk factors, symptoms, and history  Vaccine Recommendations Tetanus Booster All adults every 10 years Flu Vaccine All patients 6 months and older every year COVID Vaccine All patients 12 years and older Initial dosing with booster May recommend additional booster based on age and health history HPV Vaccine 2 doses all patients age 75-26 Dosing may be considered for patients over 26 Shingles Vaccine (Shingrix) 2 doses all adults 52 years and older Pneumonia (Pneumovax 23) All adults 44 years and older May recommend earlier dosing based on health history Pneumonia (Prevnar 8) All adults 65 years and older Dosed 1 year after Pneumovax  23  Additional Screening, Testing, and Vaccinations may be recommended on an individualized basis based on family history, health history, risk factors, and/or exposure.

## 2022-07-16 LAB — VITAMIN D 25 HYDROXY (VIT D DEFICIENCY, FRACTURES): Vit D, 25-Hydroxy: 31.7 ng/mL (ref 30.0–100.0)

## 2022-07-16 LAB — CBC WITH DIFFERENTIAL/PLATELET
Basophils Absolute: 0 10*3/uL (ref 0.0–0.2)
Basos: 1 %
EOS (ABSOLUTE): 0.1 10*3/uL (ref 0.0–0.4)
Eos: 2 %
Hematocrit: 35.7 % (ref 34.0–46.6)
Hemoglobin: 11.6 g/dL (ref 11.1–15.9)
Immature Grans (Abs): 0 10*3/uL (ref 0.0–0.1)
Immature Granulocytes: 0 %
Lymphocytes Absolute: 1.5 10*3/uL (ref 0.7–3.1)
Lymphs: 38 %
MCH: 29.9 pg (ref 26.6–33.0)
MCHC: 32.5 g/dL (ref 31.5–35.7)
MCV: 92 fL (ref 79–97)
Monocytes Absolute: 0.5 10*3/uL (ref 0.1–0.9)
Monocytes: 13 %
Neutrophils Absolute: 1.9 10*3/uL (ref 1.4–7.0)
Neutrophils: 46 %
Platelets: 375 10*3/uL (ref 150–450)
RBC: 3.88 x10E6/uL (ref 3.77–5.28)
RDW: 13.1 % (ref 11.7–15.4)
WBC: 4 10*3/uL (ref 3.4–10.8)

## 2022-07-16 LAB — HEMOGLOBIN A1C
Est. average glucose Bld gHb Est-mCnc: 103 mg/dL
Hgb A1c MFr Bld: 5.2 % (ref 4.8–5.6)

## 2022-07-16 LAB — COMPREHENSIVE METABOLIC PANEL
ALT: 13 IU/L (ref 0–32)
AST: 17 IU/L (ref 0–40)
Albumin/Globulin Ratio: 1.3 (ref 1.2–2.2)
Albumin: 3.8 g/dL — ABNORMAL LOW (ref 3.9–4.9)
Alkaline Phosphatase: 68 IU/L (ref 44–121)
BUN/Creatinine Ratio: 12 (ref 9–23)
BUN: 8 mg/dL (ref 6–24)
Bilirubin Total: 0.4 mg/dL (ref 0.0–1.2)
CO2: 21 mmol/L (ref 20–29)
Calcium: 9 mg/dL (ref 8.7–10.2)
Chloride: 101 mmol/L (ref 96–106)
Creatinine, Ser: 0.69 mg/dL (ref 0.57–1.00)
Globulin, Total: 3 g/dL (ref 1.5–4.5)
Glucose: 72 mg/dL (ref 70–99)
Potassium: 4.3 mmol/L (ref 3.5–5.2)
Sodium: 138 mmol/L (ref 134–144)
Total Protein: 6.8 g/dL (ref 6.0–8.5)
eGFR: 111 mL/min/{1.73_m2} (ref >59)

## 2022-07-16 LAB — T4, FREE: Free T4: 1.27 ng/dL (ref 0.82–1.77)

## 2022-07-16 LAB — TSH: TSH: 1.15 u[IU]/mL (ref 0.450–4.500)

## 2022-07-28 DIAGNOSIS — Z0000 Encounter for general adult medical examination without abnormal findings: Secondary | ICD-10-CM | POA: Insufficient documentation

## 2022-07-28 NOTE — Assessment & Plan Note (Signed)
Chronic. Well controlled. No alarm sx. No current medications. Will monitor.

## 2022-07-28 NOTE — Assessment & Plan Note (Signed)

## 2023-01-04 ENCOUNTER — Ambulatory Visit: Payer: 59 | Admitting: Nurse Practitioner

## 2023-01-04 ENCOUNTER — Encounter: Payer: Self-pay | Admitting: Nurse Practitioner

## 2023-01-04 VITALS — BP 130/82 | HR 78 | Wt 213.4 lb

## 2023-01-04 DIAGNOSIS — F413 Other mixed anxiety disorders: Secondary | ICD-10-CM | POA: Diagnosis not present

## 2023-01-04 NOTE — Patient Instructions (Addendum)
Please let me know if you would like to start the Lexapro. I put information for you on this medication on the back of this packet.   Contact your HR team or Manager to get the FMLA paperwork sent over to me. My fax is [phone].  Look Up Progressive Muscle Relaxation on Youtube.

## 2023-01-04 NOTE — Progress Notes (Signed)
Tollie Eth, DNP, AGNP-c [facility] [address] [place], [place] [address] [phone]  Subjective:   [name] is a 42 y.o. female presents to day for evaluation of: Analaya presents today with significant anxiety exacerbated by a high workload and personal stressors. She describes feeling overwhelmed, short-tempered, and unable to manage her stress effectively, which has been noticed by her husband and daughter. This has been ongoing since November, with a notable increase in intensity since February when her workplace offered overtime due to staffing shortages. Despite the addition of two new staff members, the workload remains unmanageable, contributing to her stress.  She also mentions a recent vacation that did not alleviate her stress as hoped, but instead added more due to a vehicle issue, which is still unresolved and awaiting a police report for insurance purposes. This incident has left her feeling even more overwhelmed over the past two weeks.   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 She feels unable to focus and experiences physical symptoms such as heart palpitations, nervousness, poor sleep, mood changes, and occasional dizziness, which she suspects might be related to high blood pressure.  She has not started any new medications but is considering counseling and possibly medication to manage her anxiety. She is currently exploring options to reduce her workload, including working from home or part-time. [name] has never taken FMLA but is now considering it due to her current state. Started in March with this level of overwhelmed. It slowly built up from January.    PMH, Medications, and Allergies reviewed and updated in chart as appropriate.   ROS negative except for what is listed in HPI. Objective:  BP 130/82   Pulse  78   Wt 213 lb 6.4 oz (96.8 kg)   BMI 38.72 kg/m  Physical Exam Vitals and nursing note reviewed.  Constitutional:      Appearance: Normal appearance.  HENT:     Head: Normocephalic.  Eyes:     Pupils: Pupils are equal, round, and reactive to light.  Cardiovascular:     Rate and Rhythm: Normal rate and regular rhythm.     Pulses: Normal pulses.     Heart sounds: Normal heart sounds.  Pulmonary:     Effort: Pulmonary effort is normal.     Breath sounds: Normal breath sounds.  Musculoskeletal:        General: Normal range of motion.     Cervical back: Normal range of motion.  Skin:    General: Skin is warm.  Neurological:     General: No focal deficit present.     Mental Status: She is alert and oriented to person, place, and time.  Psychiatric:        Mood and Affect: Mood is anxious. Affect is tearful.        Speech: Speech normal.        Behavior: Behavior normal. Behavior is cooperative.           Assessment & Plan:   Problem List Items Addressed This Visit     Other mixed anxiety disorders - Primary    Patient reports increased stress, pressure, and anger due to work-related issues and personal life events. Symptoms include inability to focus, poor sleep, tearfulness, feeling overwhelmed, palpitations, panicky, mood changes, and dizziness. Patient is considering counseling services but is unsure about medication at this point.  Plan: - Encourage  patient to seek counseling services for anxiety management - Discuss the possibility of starting anxiolytic medications such as Zoloft or Lexapro if the patient decides they are needed - Recommend progressive muscle relaxation techniques and deep breathing exercises to help manage stress and anxiety         Tollie Eth, DNP, AGNP-c 01/30/2023  10:59 PM    History, Medications, Surgery, SDOH, and Family History reviewed and updated as appropriate.

## 2023-01-30 DIAGNOSIS — F413 Other mixed anxiety disorders: Secondary | ICD-10-CM | POA: Insufficient documentation

## 2023-01-30 NOTE — Assessment & Plan Note (Signed)
Patient reports increased stress, pressure, and anger due to work-related issues and personal life events. Symptoms include inability to focus, poor sleep, tearfulness, feeling overwhelmed, palpitations, panicky, mood changes, and dizziness. Patient is considering counseling services but is unsure about medication at this point.  Plan: - Encourage patient to seek counseling services for anxiety management - Discuss the possibility of starting anxiolytic medications such as Zoloft or Lexapro if the patient decides they are needed - Recommend progressive muscle relaxation techniques and deep breathing exercises to help manage stress and anxiety

## 2023-03-07 ENCOUNTER — Other Ambulatory Visit: Payer: Self-pay | Admitting: Advanced Practice Midwife

## 2023-03-07 DIAGNOSIS — Z0000 Encounter for general adult medical examination without abnormal findings: Secondary | ICD-10-CM

## 2023-07-02 ENCOUNTER — Other Ambulatory Visit: Payer: Self-pay | Admitting: Advanced Practice Midwife

## 2023-07-02 DIAGNOSIS — Z0000 Encounter for general adult medical examination without abnormal findings: Secondary | ICD-10-CM

## 2023-08-09 ENCOUNTER — Ambulatory Visit: Payer: 59 | Admitting: Nurse Practitioner

## 2023-08-25 ENCOUNTER — Encounter: Payer: Self-pay | Admitting: Nurse Practitioner

## 2023-08-25 ENCOUNTER — Ambulatory Visit: Payer: 59 | Admitting: Nurse Practitioner

## 2023-08-25 VITALS — BP 134/90 | HR 67 | Wt 220.6 lb

## 2023-08-25 DIAGNOSIS — R809 Proteinuria, unspecified: Secondary | ICD-10-CM | POA: Diagnosis not present

## 2023-08-25 DIAGNOSIS — Z0000 Encounter for general adult medical examination without abnormal findings: Secondary | ICD-10-CM

## 2023-08-25 DIAGNOSIS — I10 Essential (primary) hypertension: Secondary | ICD-10-CM

## 2023-08-25 DIAGNOSIS — D75839 Thrombocytosis, unspecified: Secondary | ICD-10-CM

## 2023-08-25 DIAGNOSIS — D508 Other iron deficiency anemias: Secondary | ICD-10-CM

## 2023-08-25 DIAGNOSIS — R600 Localized edema: Secondary | ICD-10-CM

## 2023-08-25 DIAGNOSIS — E782 Mixed hyperlipidemia: Secondary | ICD-10-CM

## 2023-08-25 DIAGNOSIS — F413 Other mixed anxiety disorders: Secondary | ICD-10-CM

## 2023-08-25 DIAGNOSIS — Z9081 Acquired absence of spleen: Secondary | ICD-10-CM

## 2023-08-25 DIAGNOSIS — Z3041 Encounter for surveillance of contraceptive pills: Secondary | ICD-10-CM

## 2023-08-25 MED ORDER — NORGESTIMATE-ETH ESTRADIOL 0.18/0.215/0.25 MG-25 MCG PO TABS: 1.0000 | ORAL_TABLET | Freq: Every day | ORAL | 3 refills | Status: AC

## 2023-08-25 MED ORDER — HYDROCHLOROTHIAZIDE 25 MG PO TABS
25.0000 mg | ORAL_TABLET | Freq: Every day | ORAL | 1 refills | Status: AC
Start: 2023-08-25 — End: ?

## 2023-08-25 NOTE — Progress Notes (Signed)
[name], DNP, AGNP-c [facility]  [address] [place], [place] [address] [phone]  ESTABLISHED PATIENT- Chronic Health and/or Follow-Up Visit  Blood pressure (!) 134/90, pulse 67, weight 220 lb 9.6 oz (100.1 kg).    [name] is a 43 y.o. year old female presenting today for evaluation and management of chronic hypertension.   [name], with a history of hypertension and splenectomy, presents with concerns of elevated blood pressure readings at home, ranging around 147/90. She reports a recent weight gain and increased sodium intake due to dietary changes associated with her daughter's return from the [employer]. The patient acknowledges a past history of successful management of hypertension with hydrochlorothiazide and amlodipine and expresses willingness to restart hydrochlorothiazide.  In addition to hypertension, the patient reports experiencing intermittent palpitations over the past two weeks, which she describes as a sensation of her heart skipping a beat. These episodes are brief and sporadic and have been associated with periods of high stress at work. The patient has noticed a decrease in the frequency of these episodes over the past week.  The patient also mentions a history of fluid retention, with recent mild lower extremity swelling. She has noticed this swelling particularly after periods of prolonged sitting at work.  Lastly, the patient reports a history of anemia, which she attributes to her splenectomy. She expresses concern about her blood counts, as she has been previously advised to seek medical attention due to low levels. She also mentions a history of frequent colds, which she believes may be related to her lack of a spleen.  The patient is currently on birth control and is seeking a refill. She expresses a desire for permanent sterilization in the future. She also mentions a past history of an aneurysm associated with her last  pregnancy.  All ROS negative with exception of what is listed above.   PHYSICAL EXAM Physical Exam Vitals and nursing note reviewed.  Constitutional:      General: She is not in acute distress.    Appearance: Normal appearance.  HENT:     Head: Normocephalic.  Eyes:     Conjunctiva/sclera: Conjunctivae normal.  Neck:     Vascular: No carotid bruit.  Cardiovascular:     Rate and Rhythm: Normal rate and regular rhythm.     Pulses: Normal pulses.     Heart sounds: Normal heart sounds. No murmur heard. Pulmonary:     Effort: Pulmonary effort is normal.     Breath sounds: Normal breath sounds.  Musculoskeletal:     Right lower leg: No edema.     Left lower leg: No edema.  Skin:    General: Skin is warm and dry.     Capillary Refill: Capillary refill takes less than 2 seconds.  Neurological:     General: No focal deficit present.     Mental Status: She is alert and oriented to person, place, and time.     Motor: No weakness.  Psychiatric:        Mood and Affect: Mood normal.      PLAN Problem List Items Addressed This Visit     Hypertension - Primary   Hypertension Hypertension with recent readings of 147-150/90 mmHg. Currently on hydrochlorothiazide and amlodipine. Recent dietary changes with high sodium intake and weight gain noted. Discussed risks of uncontrolled hypertension including stroke and heart disease. Benefits of hydrochlorothiazide include effective blood pressure control and reduction of edema. - Start hydrochlorothiazide once daily - Check potassium levels - Monitor blood  pressure regularly - Advise on dietary modifications to reduce sodium intake - Encourage weight loss through diet and exercise      Relevant Medications   hydrochlorothiazide (HYDRODIURIL) 25 MG tablet   atorvastatin (LIPITOR) 10 MG tablet   Other Relevant Orders   Hemoglobin A1c (Completed)   CBC with Differential/Platelet (Completed)   Comprehensive metabolic panel (Completed)    Iron, TIBC and Ferritin Panel (Completed)   Lipid Panel (Completed)   Other mixed anxiety disorders   Intermittent palpitations and anxiety symptoms, likely exacerbated by stress. Symptoms have improved recently but were frequent previously. Discussed non-pharmacological approaches including stress management techniques and visualization exercises. Caffeine and electrolyte imbalances identified as potential exacerbating factors. - Consider anxiety medication if symptoms persist - Practice stress management techniques - Monitor for recurrence of symptoms      Mixed hyperlipidemia   Relevant Medications   hydrochlorothiazide (HYDRODIURIL) 25 MG tablet   atorvastatin (LIPITOR) 10 MG tablet   Other Relevant Orders   Lipid panel   Comprehensive metabolic panel   Leg edema   Intermittent leg swelling, likely related to hypertension and prolonged sitting. Reports stiffness and swelling in legs. Discussed benefits of hydrochlorothiazide for fluid retention and importance of regular movement. - Hydrochlorothiazide to help with fluid retention - Encourage regular movement and leg elevation when sitting for prolonged periods      Proteinuria   Thrombocytosis   Relevant Orders   CBC with Differential/Platelet (Completed)   Iron, TIBC and Ferritin Panel (Completed)   CBC with Differential/Platelet   Iron, TIBC and Ferritin Panel   H/O splenectomy   Relevant Orders   CBC with Differential/Platelet (Completed)   Iron, TIBC and Ferritin Panel (Completed)   Anemia   Relevant Orders   CBC with Differential/Platelet   Iron, TIBC and Ferritin Panel   Other Visit Diagnoses       Health care maintenance       Relevant Medications   Norgestimate-Eth Estradiol (TRI-LO-ESTARYLLA) 0.18/0.215/0.25 MG-25 MCG TABS     Encounter for surveillance of contraceptive pills       Relevant Medications   Norgestimate-Eth Estradiol (TRI-LO-ESTARYLLA) 0.18/0.215/0.25 MG-25 MCG TABS       Return in about 3  months (around 11/23/2023) for Med Management 30- HTN and Labs (OK to do pap at that time, if she wants).  [name], DNP, AGNP-c

## 2023-08-25 NOTE — Patient Instructions (Addendum)
We will restart the hydrochlorothiazide and see if we can get better control of your blood pressures.  We will start with 25mg  once a day and I want you to keep monitoring your blood pressure in the morning and in the evening. If your blood pressures are average 100/70 - 125/85 then we are fine to stay on this dose. If it is running higher, then we may need to make changes. Give it at least 2 weeks for full effect.   If your chest flutters come back and you feel that you need something for anxiety, please call and let me know.     For best management of weight, it is vital to balance intake versus output. This means the number of calories burned per day must be less than the calories you take in with food and drink.   I recommend trying to follow a diet with the following: Calories: 1200-1500 calories per day Carbohydrates: 150-180 grams of carbohydrates per day  Why: Gives your body enough "quick fuel" for cells to maintain normal function without sending them into starvation mode.  Protein: At least 90 grams of protein per day- 30 grams with each meal Why: Protein takes longer and uses more energy than carbohydrates to break down for fuel. The carbohydrates in your meals serves as quick energy sources and proteins help use some of that extra quick energy to break down to produce long term energy. This helps you not feel hungry as quickly and protein breakdown burns calories.  Water: Drink AT LEAST 64 ounces of water per day  Why: Water is essential to healthy metabolism. Water helps to fill the stomach and keep you fuller longer. Water is required for healthy digestion and filtering of waste in the body.  Fat: Limit fats in your diet- when choosing fats, choose foods with lower fats content such as lean meats (chicken, fish, [place]).  Why: Increased fat intake leads to storage "for later". Once you burn your carbohydrate energy, your body goes into fat and protein breakdown mode to help you  loose weight.  Cholesterol: Fats and oils that are LIQUID at room temperature are best. Choose vegetable oils (olive oil, avocado oil, nuts). Avoid fats that are SOLID at room temperature (animal fats, processed meats). Healthy fats are often found in whole grains, beans, nuts, seeds, and berries.  Why: Elevated cholesterol levels lead to build up of cholesterol on the inside of your blood vessels. This will eventually cause the blood vessels to become hard and can lead to high blood pressure and damage to your organs. When the blood flow is reduced, but the pressure is high from cholesterol buildup, parts of the cholesterol can break off and form clots that can go to the brain or heart leading to a stroke or heart attack.  Fiber: Increase amount of SOLUBLE the fiber in your diet. This helps to fill you up, lowers cholesterol, and helps with digestion. Some foods high in soluble fiber are oats, peas, beans, apples, carrots, barley, and citrus fruits.   Why: Fiber fills you up, helps remove excess cholesterol, and aids in healthy digestion which are all very important in weight management.   I recommend the following as a minimum activity routine: Purposeful walk or other physical activity at least 20 minutes every single day. This means purposefully taking a walk, jog, bike, swim, treadmill, elliptical, dance, etc.  This activity should be ABOVE your normal daily activities, such as walking at work. Goal exercise should be  at least 150 minutes a week- work your way up to this.   Heart Rate: Your maximum exercise heart rate should be 220 - Your Age in Years. When exercising, get your heart rate up, but avoid going over the maximum targeted heart rate.  60-70% of your maximum heart rate is where you tend to burn the most fat. To find this number:  220 - Age In Years= Max HR  Max HR x 0.6 (or 0.7) = Fat Burning HR The Fat Burning HR is your goal heart rate while working out to burn the most fat.   NEVER exercise to the point your feel lightheaded, weak, nauseated, dizzy. If you experience ANY of these symptoms- STOP exercise! Allow yourself to cool down and your heart rate to come down. Then restart slower next time.  If at ANY TIME you feel chest pain or chest pressure during exercise, STOP IMMEDIATELY and seek medical attention.

## 2023-08-26 LAB — LIPID PANEL
Chol/HDL Ratio: 3.4 {ratio} (ref 0.0–4.4)
Cholesterol, Total: 260 mg/dL — ABNORMAL HIGH (ref 100–199)
HDL: 77 mg/dL (ref >39)
LDL Chol Calc (NIH): 173 mg/dL — ABNORMAL HIGH (ref 0–99)
Triglycerides: 62 mg/dL (ref 0–149)
VLDL Cholesterol Cal: 10 mg/dL (ref 5–40)

## 2023-08-26 LAB — COMPREHENSIVE METABOLIC PANEL
ALT: 9 [IU]/L (ref 0–32)
AST: 19 [IU]/L (ref 0–40)
Albumin: 3.9 g/dL (ref 3.9–4.9)
Alkaline Phosphatase: 69 [IU]/L (ref 44–121)
BUN/Creatinine Ratio: 9 (ref 9–23)
BUN: 7 mg/dL (ref 6–24)
Bilirubin Total: 0.4 mg/dL (ref 0.0–1.2)
CO2: 22 mmol/L (ref 20–29)
Calcium: 9.2 mg/dL (ref 8.7–10.2)
Chloride: 101 mmol/L (ref 96–106)
Creatinine, Ser: 0.78 mg/dL (ref 0.57–1.00)
Globulin, Total: 3.3 g/dL (ref 1.5–4.5)
Glucose: 80 mg/dL (ref 70–99)
Potassium: 4.8 mmol/L (ref 3.5–5.2)
Sodium: 140 mmol/L (ref 134–144)
Total Protein: 7.2 g/dL (ref 6.0–8.5)
eGFR: 97 mL/min/{1.73_m2} (ref >59)

## 2023-08-26 LAB — CBC WITH DIFFERENTIAL/PLATELET
Basophils Absolute: 0.1 10*3/uL (ref 0.0–0.2)
Basos: 2 %
EOS (ABSOLUTE): 0.1 10*3/uL (ref 0.0–0.4)
Eos: 2 %
Hematocrit: 34.9 % (ref 34.0–46.6)
Hemoglobin: 10.8 g/dL — ABNORMAL LOW (ref 11.1–15.9)
Immature Grans (Abs): 0 10*3/uL (ref 0.0–0.1)
Immature Granulocytes: 0 %
Lymphocytes Absolute: 1.4 10*3/uL (ref 0.7–3.1)
Lymphs: 42 %
MCH: 28.6 pg (ref 26.6–33.0)
MCHC: 30.9 g/dL — ABNORMAL LOW (ref 31.5–35.7)
MCV: 92 fL (ref 79–97)
Monocytes Absolute: 0.5 10*3/uL (ref 0.1–0.9)
Monocytes: 14 %
Neutrophils Absolute: 1.3 10*3/uL — ABNORMAL LOW (ref 1.4–7.0)
Neutrophils: 40 %
Platelets: 499 10*3/uL — ABNORMAL HIGH (ref 150–450)
RBC: 3.78 x10E6/uL (ref 3.77–5.28)
RDW: 12.6 % (ref 11.7–15.4)
WBC: 3.4 10*3/uL (ref 3.4–10.8)

## 2023-08-26 LAB — IRON,TIBC AND FERRITIN PANEL
Ferritin: 8 ng/mL — ABNORMAL LOW (ref 15–150)
Iron Saturation: 8 % — CL (ref 15–55)
Iron: 39 ug/dL (ref 27–159)
Total Iron Binding Capacity: 473 ug/dL — ABNORMAL HIGH (ref 250–450)
UIBC: 434 ug/dL — ABNORMAL HIGH (ref 131–425)

## 2023-08-26 LAB — HEMOGLOBIN A1C
Est. average glucose Bld gHb Est-mCnc: 100 mg/dL
Hgb A1c MFr Bld: 5.1 % (ref 4.8–5.6)

## 2023-08-31 ENCOUNTER — Encounter: Payer: Self-pay | Admitting: Nurse Practitioner

## 2023-08-31 DIAGNOSIS — R600 Localized edema: Secondary | ICD-10-CM | POA: Insufficient documentation

## 2023-08-31 DIAGNOSIS — E782 Mixed hyperlipidemia: Secondary | ICD-10-CM | POA: Insufficient documentation

## 2023-08-31 MED ORDER — ATORVASTATIN CALCIUM 10 MG PO TABS: 10.0000 mg | ORAL_TABLET | Freq: Every day | ORAL | 3 refills | Status: AC

## 2023-08-31 NOTE — Assessment & Plan Note (Signed)
Hypertension Hypertension with recent readings of 147-150/90 mmHg. Currently on hydrochlorothiazide and amlodipine. Recent dietary changes with high sodium intake and weight gain noted. Discussed risks of uncontrolled hypertension including stroke and heart disease. Benefits of hydrochlorothiazide include effective blood pressure control and reduction of edema. - Start hydrochlorothiazide once daily - Check potassium levels - Monitor blood pressure regularly - Advise on dietary modifications to reduce sodium intake - Encourage weight loss through diet and exercise

## 2023-08-31 NOTE — Assessment & Plan Note (Signed)
Intermittent leg swelling, likely related to hypertension and prolonged sitting. Reports stiffness and swelling in legs. Discussed benefits of hydrochlorothiazide for fluid retention and importance of regular movement. - Hydrochlorothiazide to help with fluid retention - Encourage regular movement and leg elevation when sitting for prolonged periods

## 2023-08-31 NOTE — Assessment & Plan Note (Signed)
Intermittent palpitations and anxiety symptoms, likely exacerbated by stress. Symptoms have improved recently but were frequent previously. Discussed non-pharmacological approaches including stress management techniques and visualization exercises. Caffeine and electrolyte imbalances identified as potential exacerbating factors. - Consider anxiety medication if symptoms persist - Practice stress management techniques - Monitor for recurrence of symptoms

## 2023-09-05 NOTE — Progress Notes (Signed)
Pt has CPE with labs 5/25/

## 2023-09-24 ENCOUNTER — Emergency Department (HOSPITAL_COMMUNITY)
Admission: EM | Admit: 2023-09-24 | Discharge: 2023-09-24 | Disposition: A | Discharge status: Home / Self Care | Payer: 59 | Attending: Emergency Medicine | Admitting: Emergency Medicine

## 2023-09-24 ENCOUNTER — Emergency Department (HOSPITAL_COMMUNITY): Payer: 59

## 2023-09-24 DIAGNOSIS — J069 Acute upper respiratory infection, unspecified: Secondary | ICD-10-CM | POA: Insufficient documentation

## 2023-09-24 DIAGNOSIS — Z79899 Other long term (current) drug therapy: Secondary | ICD-10-CM | POA: Diagnosis not present

## 2023-09-24 DIAGNOSIS — R519 Headache, unspecified: Secondary | ICD-10-CM

## 2023-09-24 LAB — CBC
HCT: 34.2 % — ABNORMAL LOW (ref 36.0–46.0)
Hemoglobin: 10.9 g/dL — ABNORMAL LOW (ref 12.0–15.0)
MCH: 29.1 pg (ref 26.0–34.0)
MCHC: 31.9 g/dL (ref 30.0–36.0)
MCV: 91.2 fL (ref 80.0–100.0)
Platelets: 437 10*3/uL — ABNORMAL HIGH (ref 150–400)
RBC: 3.75 MIL/uL — ABNORMAL LOW (ref 3.87–5.11)
RDW: 14.6 % (ref 11.5–15.5)
WBC: 2.9 10*3/uL — ABNORMAL LOW (ref 4.0–10.5)
nRBC: 0 % (ref 0.0–0.2)

## 2023-09-24 LAB — BASIC METABOLIC PANEL
Anion gap: 8 (ref 5–15)
BUN: 10 mg/dL (ref 6–20)
CO2: 26 mmol/L (ref 22–32)
Calcium: 8.5 mg/dL — ABNORMAL LOW (ref 8.9–10.3)
Chloride: 104 mmol/L (ref 98–111)
Creatinine, Ser: 0.68 mg/dL (ref 0.44–1.00)
GFR, Estimated: >60 mL/min (ref >60)
Glucose, Bld: 91 mg/dL (ref 70–99)
Potassium: 3.7 mmol/L (ref 3.5–5.1)
Sodium: 138 mmol/L (ref 135–145)

## 2023-09-24 LAB — RESP PANEL BY RT-PCR (RSV, FLU A&B, COVID)  RVPGX2
Influenza A by PCR: NEGATIVE
Influenza B by PCR: NEGATIVE
Resp Syncytial Virus by PCR: NEGATIVE
SARS Coronavirus 2 by RT PCR: NEGATIVE

## 2023-09-24 MED ORDER — IOHEXOL 350 MG/ML SOLN
75.0000 mL | Freq: Once | INTRAVENOUS | Status: AC | PRN
  Administered 2023-09-24: 75 mL via INTRAVENOUS

## 2023-09-24 NOTE — ED Provider Notes (Signed)
 [place] EMERGENCY DEPARTMENT AT [facility] Provider Note   CSN: [id number] Arrival date & time: 09/24/23  8295     History  Chief Complaint  Patient presents with   URI   Headache    [name] is a 43 y.o. female.   URI Associated symptoms: headaches   Headache Associated symptoms: URI   Patient presents with 6 days of URI type symptoms.  Cough.  Headache.  Daughter had similar symptoms.  States URI symptoms are improving but has had a headache.  Headaches been there for 5 or 6 days now also.  More of a constant headache that she has had previously.  However years ago after delivery did have a subarachnoid hemorrhage and reported aneurysm.  Does occasionally get headaches.  No photophobia.  No fever.  No trauma.     Home Medications Prior to Admission medications   Medication Sig Start Date End Date Taking? Authorizing Provider  atorvastatin (LIPITOR) 10 MG tablet Take 1 tablet (10 mg total) by mouth at bedtime. 08/31/23   Tollie Eth, NP  Ferrous Sulfate (IRON PO) Take by mouth. Patient not taking: Reported on 08/25/2023    [provider]  hydrochlorothiazide (HYDRODIURIL) 25 MG tablet Take 1 tablet (25 mg total) by mouth daily. 08/25/23   Tollie Eth, NP  Multiple Vitamin (MULTIVITAMIN WITH MINERALS) TABS tablet Take 1 tablet by mouth daily.    [provider]  Norgestimate-Eth Estradiol (TRI-LO-ESTARYLLA) 0.18/0.215/0.25 MG-25 MCG TABS Take 1 tablet by mouth daily. 08/25/23   Tollie Eth, NP  Omega-3 Fatty Acids (FISH OIL) 500 MG CAPS Take 1 capsule by mouth daily.    [provider]      Allergies    Patient has no known allergies.    Review of Systems   Review of Systems  Neurological:  Positive for headaches.    Physical Exam Updated Vital Signs BP (!) 149/94 (BP Location: Left Arm)   Pulse 62   Temp 98.6 F (37 C) (Oral)   Resp 16   SpO2 99%  Physical Exam Vitals and nursing note reviewed.  HENT:      Head: Atraumatic.  Eyes:     Pupils: Pupils are equal, round, and reactive to light.  Cardiovascular:     Rate and Rhythm: Normal rate.  Pulmonary:     Breath sounds: Normal breath sounds.  Skin:    General: Skin is warm.  Neurological:     Mental Status: She is alert.     ED Results / Procedures / Treatments   Labs (all labs ordered are listed, but only abnormal results are displayed) Labs Reviewed  BASIC METABOLIC PANEL - Abnormal; Notable for the following components:      Result Value   Calcium 8.5 (*)    All other components within normal limits  CBC - Abnormal; Notable for the following components:   WBC 2.9 (*)    RBC 3.75 (*)    Hemoglobin 10.9 (*)    HCT 34.2 (*)    Platelets 437 (*)    All other components within normal limits  RESP PANEL BY RT-PCR (RSV, FLU A&B, COVID)  RVPGX2    EKG None  Radiology CT Angio Head W or Wo Contrast Result Date: 09/24/2023 CLINICAL DATA:  43 year old female with headache. History of subarachnoid hemorrhage, postpartum. EXAM: CT ANGIOGRAPHY HEAD WITH AND WITHOUT CONTRAST TECHNIQUE: Multidetector CT imaging of the head and neck was performed using the standard protocol during bolus  administration of intravenous contrast. Multiplanar CT image reconstructions and MIPs were obtained to evaluate the vascular anatomy. Carotid stenosis measurements (when applicable) are obtained utilizing NASCET criteria, using the distal internal carotid diameter as the denominator. RADIATION DOSE REDUCTION: This exam was performed according to the departmental dose-optimization program which includes automated exposure control, adjustment of the mA and/or kV according to patient size and/or use of iterative reconstruction technique. CONTRAST:  75mL OMNIPAQUE IOHEXOL 350 MG/ML SOLN COMPARISON:  Head CTs 02/17/2020 and earlier.  CTA head 03/24/2008. FINDINGS: CT HEAD Brain: Normal cerebral volume. No midline shift, ventriculomegaly, mass effect, evidence of  mass lesion, intracranial hemorrhage or evidence of cortically based acute infarction. Gray-white matter differentiation is within normal limits throughout the brain. Mega cisterna magna, normal variant. Chronic partially empty sella. Calvarium and skull base: Intact, negative. Paranasal sinuses: Visualized paranasal sinuses and mastoids are clear. Orbits: Visualized orbits and scalp soft tissues are within normal limits. CTA HEAD Posterior circulation: Codominant distal vertebral arteries with tortuous vertebrobasilar junction, patent without plaque or stenosis. Patent mildly tortuous basilar artery without stenosis. Normal SCA and PCA origins. Posterior communicating arteries are diminutive or absent. Bilateral PCA branches are stable and within normal limits. Anterior circulation: Tortuous ICAs below the skull base, better demonstrated previously. Both ICA siphons are patent with no plaque or stenosis. Patent carotid termini. Normal MCA and ACA origins. Tortuous A1 segments. Diminutive or absent anterior communicating artery. Bilateral ACA branches are stable and within normal limits. Left MCA M1 segment and bifurcation appear patent without stenosis. Right MCA M1 segment and bifurcation appear patent without stenosis. Bilateral MCA branches are stable and within normal limits. Venous sinuses: Patent, with an effaced appearance of the bilateral transverse and sigmoid venous sinus junctions. Also the right transverse and sigmoid appear dominant. Anatomic variants: Dominant right transverse venous sinus. Review of the MIP images confirms the above findings IMPRESSION: 1. Negative CTA Head. 2. Normal CT appearance of the brain aside from chronic partially empty sella, effaced appearance of the transverse and sigmoid venous sinus junctions. These can be normal anatomic variant, but also are associated with idiopathic intracranial hypertension (pseudotumor cerebri). Electronically Signed   By: Odessa Fleming M.D.   On:  09/24/2023 09:04    Procedures Procedures    Medications Ordered in ED Medications  iohexol (OMNIPAQUE) 350 MG/ML injection 75 mL (75 mLs Intravenous Contrast Given 09/24/23 6578)    ED Course/ Medical Decision Making/ A&P                                 Medical Decision Making Amount and/or Complexity of Data Reviewed Labs: ordered. Radiology: ordered.  Risk Prescription drug management.   Patient URI symptoms.  Well-appearing.  Reassuring vitals.  Symptoms appear to be proving.  Will get flu testing.  Has had recent negative COVID test at home but influenza A is prevalent in the community at this time.  It would help explain the headache.  However with previous subarachnoid hemorrhage will get CT and CTA.  CTA reassuring.  No bleeding.  However does have partially empty sella.  Is chronic but could be causing some of the headache.  Can follow-up with neurology as an outpatient.  Will discharge home.        Final Clinical Impression(s) / ED Diagnoses Final diagnoses:  Nonintractable headache, unspecified chronicity pattern, unspecified headache type  Upper respiratory tract infection, unspecified type    Rx / DC  Orders ED Discharge Orders          Ordered    Ambulatory referral to Neurology       Comments: An appointment is requested in approximately: 2 weeks   09/24/23 0958              Benjiman Core, MD 09/24/23 [phone]

## 2023-09-24 NOTE — ED Triage Notes (Signed)
 Pt reports URI symptoms of cough, congestion, and headache for one week. Pt states headache is driving factor in presenting today d/t her hx of brain aneurism. Pt states headache was gradual in onset, no vision changes, no unilateral weakness.

## 2023-09-24 NOTE — Discharge Instructions (Addendum)
 With your headaches and partially empty sella follow-up with your primary care doctor or neurology.

## 2023-09-29 ENCOUNTER — Ambulatory Visit: Payer: 59 | Admitting: Neurology

## 2023-09-29 VITALS — BP 138/95 | HR 99 | Ht 64.0 in | Wt 217.0 lb

## 2023-09-29 DIAGNOSIS — E236 Other disorders of pituitary gland: Secondary | ICD-10-CM | POA: Diagnosis not present

## 2023-09-29 DIAGNOSIS — R519 Headache, unspecified: Secondary | ICD-10-CM | POA: Diagnosis not present

## 2023-09-29 DIAGNOSIS — E669 Obesity, unspecified: Secondary | ICD-10-CM | POA: Diagnosis not present

## 2023-09-29 DIAGNOSIS — Z9189 Other specified personal risk factors, not elsewhere classified: Secondary | ICD-10-CM | POA: Diagnosis not present

## 2023-09-29 DIAGNOSIS — R030 Elevated blood-pressure reading, without diagnosis of hypertension: Secondary | ICD-10-CM

## 2023-09-29 NOTE — Progress Notes (Signed)
 Subjective:    Patient ID: [name] is a 43 y.o. female.  HPI    Huston Foley, MD, PhD [facility] [address], [address] [address] [place], [place] [address]  I saw patient, [name] is a referral from the emergency room for, evaluation of her headache and finding of empty sella.  The patient is unaccompanied today.  Ms. [name] is a 43 year old female with an underlying medical history of hypertension, thrombocytosis, history of small bowel obstruction, anemia, status post splenectomy, history of postpartum subarachnoid hemorrhage, and obesity, who reports recurrent headaches for the past few months.  Her headaches are typically not severe or throbbing, sometimes in the front, typically no associated nausea or vomiting but does feel dizzy sometimes, denies vertiginous symptoms such as spinning sensation or lightheadedness such as headache feeling however. She has not had any caffeine since December 2024.  She stopped taking her phentermine about a month ago.  She is a non-smoker.  She does not drink any alcohol.  She has never had a sleep study.  She is not aware of any snoring and not aware of any family history of sleep apnea.  She does have occasional blurry vision when she stares at the computer screen too long.  She denies any double vision or loss of vision.  Her last eye examination has been about 2 years ago at [facility]. Her Epworth sleepiness score is 4 out of 24, fatigue severity score is 9 out of 63. She presented to the emergency room on 09/24/2023 with URI symptoms and headaches.  I have reviewed the emergency room records.  She was found to have a headache in the context of flulike illness.  She had a CT angiogram of the head with and without contrast on 09/24/2023 and I reviewed the results:  IMPRESSION: 1. Negative CTA Head. 2. Normal CT appearance of the brain aside from chronic partially empty sella, effaced appearance  of the transverse and sigmoid venous sinus junctions. These can be normal anatomic variant, but also are associated with idiopathic intracranial hypertension (pseudotumor cerebri).    In addition, I personally and independently reviewed images through the PACS system.  I agree with finding of partially empty sella.  She had a brain MRI with and without contrast on 03/27/2008 and I reviewed the results:    IMPRESSION:  Symmetric subarachnoid hemorrhage in the occipital and  parietal  regions.  No explanation for subarachnoid hemorrhage is identified  on the MRI.   In addition, I personally and independently reviewed images through the PACS system.   She had a CT angiogram of the head with and without contrast on 03/24/2008 and I reviewed the results:    IMPRESSION:    1. No evidence for significant aneurysm, stenosis, or branch  vessel occlusion.  2.  No focal lesion to explain subarachnoid hemorrhage.   In addition, I personally and independently reviewed images through the PACS system.  She had a head CT without contrast on 03/24/2008 and I reviewed the results: IMPRESSION:    1. Small subarachnoid hemorrhage near the vertex.  No underlying  cause is identified. Query aneurysm. Critical test results  telephoned to Dr. Estell Harpin at the time of interpretation on date  03/24/2008 at time 08:39 a.m.   Her Past Medical History Is Significant For: Past Medical History:  Diagnosis Date   Abdominal pain 02/14/2020   Anemia 08/21/2020   Aneurysm (HCC)    Costochondral chest pain 09/22/2020   ERRONEOUS  ENCOUNTER--DISREGARD 08/05/2020   H/O splenectomy 08/21/2020   Hypertension    Hypertension    Sebaceous cyst of right axilla 11/03/2015   Small bowel obstruction (HCC) 02/14/2020   Thrombocytosis 08/21/2020    Her Past Surgical History Is Significant For: Past Surgical History:  Procedure Laterality Date   SPLENECTOMY, TOTAL      Her Family History Is Significant  For: Family History  Problem Relation Age of Onset   Diabetes Mother    Hypertension Mother    Diabetes Father    Hypertension Father    Asthma Daughter     Her Social History Is Significant For: Social History   Socioeconomic History   Marital status: Married    Spouse name: Not on file   Number of children: 3   Years of education: Not on file   Highest education level: Not on file  Occupational History   Not on file  Tobacco Use   Smoking status: Never   Smokeless tobacco: Never  Vaping Use   Vaping status: Never Used  Substance and Sexual Activity   Alcohol use: No    Alcohol/week: 0.0 standard drinks of alcohol   Drug use: No   Sexual activity: Yes    Birth control/protection: Pill  Other Topics Concern   Not on file  Social History Narrative   Not on file   Social Drivers of Health   Financial Resource Strain: Low Risk  (05/07/2021)   Overall Financial Resource Strain (CARDIA)    Difficulty of Paying Living Expenses: Not hard at all  Food Insecurity: No Food Insecurity (05/07/2021)   Hunger Vital Sign    Worried About Running Out of Food in the Last Year: Never true    Ran Out of Food in the Last Year: Never true  Transportation Needs: No Transportation Needs (05/07/2021)   PRAPARE - Administrator, Civil Service (Medical): No    Lack of Transportation (Non-Medical): No  Physical Activity: Sufficiently Active (05/07/2021)   Exercise Vital Sign    Days of Exercise per Week: 6 days    Minutes of Exercise per Session: 60 min  Stress: No Stress Concern Present (05/07/2021)   [employer] of Occupational Health - Occupational Stress Questionnaire    Feeling of Stress : Not at all  Social Connections: Socially Integrated (05/07/2021)   Social Connection and Isolation Panel [NHANES]    Frequency of Communication with Friends and Family: More than three times a week    Frequency of Social Gatherings with Friends and Family: Once a week    Attends  Religious Services: More than 4 times per year    Active Member of [employer] or Organizations: Yes    Attends Banker Meetings: 1 to 4 times per year    Marital Status: Married    Her Allergies Are:  No Known Allergies:   Her Current Medications Are:  Outpatient Encounter Medications as of 09/29/2023  Medication Sig   atorvastatin (LIPITOR) 10 MG tablet Take 1 tablet (10 mg total) by mouth at bedtime.   Ferrous Sulfate (IRON PO) Take by mouth.   hydrochlorothiazide (HYDRODIURIL) 25 MG tablet Take 1 tablet (25 mg total) by mouth daily.   Multiple Vitamin (MULTIVITAMIN WITH MINERALS) TABS tablet Take 1 tablet by mouth daily.   Norgestimate-Eth Estradiol (TRI-LO-ESTARYLLA) 0.18/0.215/0.25 MG-25 MCG TABS Take 1 tablet by mouth daily.   Omega-3 Fatty Acids (FISH OIL) 500 MG CAPS Take 1 capsule by mouth daily.   phentermine (ADIPEX-P) 37.5  MG tablet Take 37.5 mg by mouth daily.   No facility-administered encounter medications on file as of 09/29/2023.  :   Review of Systems:  Out of a complete 14 point review of systems, all are reviewed and negative with the exception of these symptoms as listed below:  Review of Systems  Neurological:        Patient in room #5 and alone. Patient states she her to discuss partially empty sella. ESS-4 & FSS-9    Objective:  Neurological Exam  Physical Exam Physical Examination:   Vitals:   09/29/23 1406  BP: (!) 138/95  Pulse: 99    General Examination: The patient is a very pleasant 43 y.o. female in no acute distress. She appears well-developed and well-nourished and well groomed.   HEENT: Normocephalic, atraumatic, pupils are equal, round and reactive to light, funduscopic exam benign, no obvious papilledema, no photophobia.  Extraocular tracking is good without limitation to gaze excursion or nystagmus noted. Visual fields are full by finger perimetry.  Hearing is grossly intact. Face is symmetric with normal facial animation and  normal facial sensation. Speech is clear with no dysarthria noted. There is no hypophonia. There is no lip, neck/head, jaw or voice tremor. Neck is supple with full range of passive and active motion. There are no carotid bruits on auscultation. Oropharynx exam reveals: mild mouth dryness, good dental hygiene and moderate airway crowding, due to small airway entry, tonsils on the smaller side but tongue wider, Mallampati class II.  Neck circumference 13-3/8 inches.  Minimal overbite noted.  Tongue protrudes centrally and palate elevates symmetrically.  Chest: Clear to auscultation without wheezing, rhonchi or crackles noted.  Heart: S1+S2+0, regular and normal without murmurs, rubs or gallops noted.   Abdomen: Soft, non-tender and non-distended.  Extremities: There is no pitting edema in the distal lower extremities bilaterally.   Skin: Warm and dry without trophic changes noted.   Musculoskeletal: exam reveals no obvious joint deformities.   Neurologically:  Mental status: The patient is awake, alert and oriented in all 4 spheres. Her immediate and remote memory, attention, language skills and fund of knowledge are appropriate. There is no evidence of aphasia, agnosia, apraxia or anomia. Speech is clear with normal prosody and enunciation. Thought process is linear. Mood is normal and affect is normal.  Cranial nerves II - XII are as described above under HEENT exam.  Motor exam: Normal bulk, strength and tone is noted. There is no obvious action or resting tremor.  No drift or rebound. Fine motor skills and coordination: intact finger taps, hand movements and rapid alternating patting with both upper extremities, normal foot taps bilaterally in the lower extremities.  Cerebellar testing: No dysmetria or intention tremor. There is no truncal or gait ataxia.  Normal finger-to-nose, normal heel-to-shin bilaterally. Sensory exam: intact to light touch in the upper and lower extremities.  Reflexes  2+ throughout, toes are downgoing bilaterally. Romberg negative. Gait, station and balance: She stands easily. No veering to one side is noted. No leaning to one side is noted. Posture is age-appropriate and stance is narrow based. Gait shows normal stride length and normal pace. No problems turning are noted.  Tandem walk normal.  Assessment and plan:  In summary, Olamae Ferrara is a very pleasant 43 y.o.-year old female with an underlying medical history of hypertension, thrombocytosis, history of small bowel obstruction, anemia, status post splenectomy, history of postpartum subarachnoid hemorrhage, and obesity, who presents for evaluation of her recurrent headaches of several  months duration.  Headache description not classic for migraines.  Headache etiology may be multifactorial.  Neurological exam is nonfocal and she is reassured.  We talked about her recent imaging test results.  She is advised to get a full, dilated eye examination.  This was an extended visit with high complexity secondary to copious record review involved and considerable counseling and coordination of care.  She was found to have a partially empty sella, I explained this to her at length.  Below is a summary of my recommendations and discussion points from today's visit.  She was given these instructions verbally and also in detail in writing in her after visit summary which she indicated she could access electronically through MyChart:  <<You were found to have a so-called "partially empty sella". This is typically an incidental finding but can be associated with a condition called idiopathic intracranial hypertension (IIH) aka a condition called "pseudotumor cerebri".  This means that there may be an increased fluid pressure around the brain, resulting in pressure on your brain, which can cause headaches, and pressure on the eye nerve(s), causing blurry vision, visual distortion and even loss of vision in extreme cases.     I would like to suggest a few things based on the MRI findings:   1. I would like for you to get a formal eye exam.  I recommend a full, dilated, eye exam including visual field testing, to see if there is any loss of peripheral vision. We can make a referral to ophthalmology if needed, otherwise you can see any optometrist or ophthalmologist of your choosing.  Please ask them to send records of their office visit to [place]. For now, you can go to [facility] in [place].  3.  Based on the eye evaluation, we may consider a LP (lumbar puncture/spinal tap) with pressure testing on your spinal fluid and routine fluid testing. Eventually, if the spinal fluid pressure is indeed elevated, we will consider starting you on a medication called diamox to help keep the spinal fluid pressure at bay.  Some people need more than 1 spinal tap over time. 5. Please follow-up with your PCP to discuss weight loss options; phentermine can cause headaches.  Losing weight can improve the risk for intracranial hypertension. 6. We will also do a sleep study to rule out obstructive sleep apnea.  If you have obstructive sleep apnea, I will want you to start treatment with a machine called CPAP or AutoPAP.  Treatment of obstructive sleep apnea can help headaches, also help with metabolism and weight loss, and ultimately, treatment of sleep apnea can reduce risk for cardiovascular complications including heart disease and stroke risk. 7. We may consider medication options, such a migraine medication in the near future.  >>  We will plan to follow-up in about 3 months.   I answered all her questions today and she was in agreement with our approach.  Huston Foley, MD, PhD

## 2023-09-29 NOTE — Patient Instructions (Addendum)
 You were found to have a so-called "partially empty sella". This is typically an incidental finding but can be associated with a condition called idiopathic intracranial hypertension (IIH) aka a condition called "pseudotumor cerebri".  This means that there may be an increased fluid pressure around the brain, resulting in pressure on your brain, which can cause headaches, and pressure on the eye nerve(s), causing blurry vision, visual distortion and even loss of vision in extreme cases.    I would like to suggest a few things based on the MRI findings:   1. I would like for you to get a formal eye exam.  I recommend a full, dilated, eye exam including visual field testing, to see if there is any loss of peripheral vision. We can make a referral to ophthalmology if needed, otherwise you can see any optometrist or ophthalmologist of your choosing.  Please ask them to send records of their office visit to [place]. For now, you can go to [facility] in [place].  3.  Based on the eye evaluation, we may consider a LP (lumbar puncture/spinal tap) with pressure testing on your spinal fluid and routine fluid testing. Eventually, if the spinal fluid pressure is indeed elevated, we will consider starting you on a medication called diamox to help keep the spinal fluid pressure at bay.  Some people need more than 1 spinal tap over time. 5. Please follow-up with your PCP to discuss weight loss options; phentermine can cause headaches.  Losing weight can improve the risk for intracranial hypertension. 6. We will also do a sleep study to rule out obstructive sleep apnea.  If you have obstructive sleep apnea, I will want you to start treatment with a machine called CPAP or AutoPAP.  Treatment of obstructive sleep apnea can help headaches, also help with metabolism and weight loss, and ultimately, treatment of sleep apnea can reduce risk for cardiovascular complications including heart disease and stroke risk. 7. We may  consider medication options, such a migraine medication in the near future.

## 2023-10-10 ENCOUNTER — Telehealth: Payer: Self-pay | Admitting: Neurology

## 2023-10-10 NOTE — Telephone Encounter (Signed)
 LVM for pt to call back to schedule   [employer] auth: [id number] (exp. 10/05/23 to 12/28/23)

## 2023-10-19 NOTE — Telephone Encounter (Signed)
 I attempted to call the patient again, she did not pick up. I left a voicemail with my direct number.

## 2023-12-09 ENCOUNTER — Telehealth: Payer: Self-pay | Admitting: Nurse Practitioner

## 2023-12-09 ENCOUNTER — Encounter: Payer: Self-pay | Admitting: Nurse Practitioner

## 2023-12-09 ENCOUNTER — Telehealth: Payer: Self-pay | Admitting: Internal Medicine

## 2023-12-09 NOTE — Telephone Encounter (Signed)
 Odilia Bennett spoke to patient  Copied from CRM [id number]. Topic: General - Other >> Dec 09, 2023  1:48 PM Marissa P wrote: Reason for CRM: Please follow up with patient concerning medical issue having at the moment needs to talk about fmla paperwork as well please. [phone]

## 2023-12-09 NOTE — Telephone Encounter (Signed)
 Pt is scheduled on Monday with Lovett Ruck

## 2023-12-09 NOTE — Telephone Encounter (Signed)
 FMLA paperwork was emailed to me and it was printed off. Pt is aware [name] will be out of the office until 5/22.

## 2023-12-12 ENCOUNTER — Ambulatory Visit: Admitting: Medical

## 2023-12-12 ENCOUNTER — Encounter: Payer: Self-pay | Admitting: Medical

## 2023-12-12 VITALS — BP 128/68 | HR 72 | Wt 212.2 lb

## 2023-12-12 DIAGNOSIS — Z634 Disappearance and death of family member: Secondary | ICD-10-CM | POA: Diagnosis not present

## 2023-12-12 DIAGNOSIS — R519 Headache, unspecified: Secondary | ICD-10-CM

## 2023-12-12 DIAGNOSIS — F419 Anxiety disorder, unspecified: Secondary | ICD-10-CM

## 2023-12-12 DIAGNOSIS — R4589 Other symptoms and signs involving emotional state: Secondary | ICD-10-CM | POA: Diagnosis not present

## 2023-12-12 NOTE — Progress Notes (Signed)
 Subjective:  [name] is a 43 y.o. female who presents for Chief Complaint  Patient presents with   Mental Health Problem    No focus, under stress and very depressed, mental and emotional drain, anxiety, mom and best friend just passed away, needs FMLA due to not being able to go back to work     Here for concerns with stress and mood.  Having trouble with focus.    Mom passed recently, her funeral was a week ago.  She had bronchitis, low hemoglobin, losing blood.  She ended up getting sepsis at [facility], on ventilator and subsequently died.  This was a sudden illness.     Best friend from high school just died recently of leukemia, and her funeral is this weekend.   [name] has been asked to speak at the funeral this weekend.  Feeling depressed, stressed, can't focus on work at the moment.  Not mentally prepared to go back to work yet  She had appt to discuss schedule and short term out of work, but the day she was supposed to talk with supervisor was when she learned of her friend's death.  Has a lot of work stress in general.  Works at [employer]. Job has been very stressful in the past year, short staffed, and hard to keep up.  She notes hx/o migraines. Saw neurology recently. Has to do sleep study soon.  Sees neurology again later this month.   Been worrying about her own health, headaches, sleep study coming up.  Hasn't established with counselor yet.  No prior counseling appt.  08/2023 came to see PCP due to workload and short staffed at work.   Was having a lot of anxiety at that time.    So far she has missed 2 weeks of work, starting 11/28/23, had a week of bereavement time, and is wanting to return to work 01/01/24   Her daughter just came home from marines, and she is hopeful that spending some time with daughter will be helpful to get her through the next few weeks  No other aggravating or relieving factors.    No other c/o.     12/12/2023     1:54 PM 01/04/2023   10:08 AM 07/15/2022    2:31 PM 05/07/2021    3:15 PM 08/25/2020   12:56 PM  Depression screen PHQ 2/9  Decreased Interest 1 0 0 0 0  Down, Depressed, Hopeless 1 0 0 0 0  PHQ - 2 Score 2 0 0 0 0  Altered sleeping 1   0   Tired, decreased energy 1   0   Change in appetite 1   0   Feeling bad or failure about yourself  0   0   Trouble concentrating    0   Moving slowly or fidgety/restless 1   0   Suicidal thoughts 0   0   PHQ-9 Score 6   0   Difficult doing work/chores Very difficult         The following portions of the patient's history were reviewed and updated as appropriate: allergies, current medications, past family history, past medical history, past social history, past surgical history and problem list.  ROS Otherwise as in subjective above  Objective: BP 128/68   Pulse 72   Wt 212 lb 3.2 oz (96.3 kg)   SpO2 98%   BMI 36.42 kg/m   General appearance: alert, no distress, well developed, well nourished HEENT: normocephalic, sclerae  anicteric, conjunctiva pink and moist, TMs pearly, nares patent, no discharge or erythema, pharynx normal Oral cavity: MMM, no lesions Neck: supple, no lymphadenopathy, no thyromegaly, no masses Heart: RRR, normal S1, S2, no murmurs Lungs: CTA bilaterally, no wheezes, rhonchi, or rales Abdomen: +bs, soft, non tender, non distended, no masses, no hepatomegaly, no splenomegaly Pulses: 2+ radial pulses, 2+ pedal pulses, normal cap refill Ext: no edema   CT angio head 09/24/23 IMPRESSION: 1. Negative CTA Head. 2. Normal CT appearance of the brain aside from chronic partially empty sella, effaced appearance of the transverse and sigmoid venous sinus junctions. These can be normal anatomic variant, but also are associated with idiopathic intracranial hypertension (pseudotumor cerebri).    Assessment: Encounter Diagnoses  Name Primary?   Depressed mood Yes   Bereavement    Anxiety    Nonintractable headache,  unspecified chronicity pattern, unspecified headache type      Plan: We discussed her concerns, recent deaths in the family.  I expressed my sympathy for her loss.  We discussed her other stressors as well and her neurology evaluation and follow-up plans.  I gave her a list of counselors and advised she needs to go ahead and established with counseling right away.  She has employee assistance program through work and she will check with them first about counseling.  She declines any medication currently to help with diet.  I will work on her paperwork.  We discussed a period of time to take off work to help get mind clear, get thoughts in order, get to through the period of bereavement.   Follow up by end of the month, sooner as needed  [name] was seen today for mental health problem.  Diagnoses and all orders for this visit:  Depressed mood  Bereavement  Anxiety  Nonintractable headache, unspecified chronicity pattern, unspecified headache type    Follow up: 2-3 weeks

## 2023-12-12 NOTE — Patient Instructions (Signed)
Counseling Services  [facility] Regal [phone] [address] [place], [place] [address]   [facility], counseling [address] [place], [place] [address] [phone]   [facility] [phone] [address],  [place], [place] [address]   Dr. Len Blalock Child and teen psychiatrist [address], [place], [place] [address] [phone]   Dr. Nicole Cella, [place] [phone] [place], [place] [address]   [facility] [address] [place], [place] [address] [phone]    [facility] Crisis Line and Main phone number [phone]  [facility]  [phone]  [facility] [phone]  Adult Crisis Center [phone]   [facility] [address] [place], [place], [place] [address] [phone]  [facility],  [address],  [place], [place] [address] [phone]    [facility] [phone] [address] [place], [place], [place] [address]   [facility] [phone] office [address] [place], [place] [address] Crisis services, Family support, in home therapy, treatment for Anxiety, PTSD, Sexual Assault, Substance Abuse, Financial/Credit Counseling, Variety of other services     Other Resources  If you are experiencing a mental health crisis or an emergency, please call 911 or go to the nearest emergency department.  [facility]   [phone] [facility]  [phone] [facility]   [phone]  Suicide Hotline 1-800-Suicide (630)410-8066)  National Suicide Prevention Lifeline 623-658-6650  (508)507-8219)  Domestic Violence, Rape/Crisis - [facility] [place] 892-119-4174  [employer] Violence Hotline 1-800-799-SAFE 618-116-3114)  To report Child or Elder Abuse, please call: [facility] Police Department  [phone] [facility] Department  [phone]  Teen Crisis line [phone] or [phone]

## 2023-12-23 ENCOUNTER — Encounter: Payer: Self-pay | Admitting: Nurse Practitioner

## 2023-12-23 ENCOUNTER — Ambulatory Visit (INDEPENDENT_AMBULATORY_CARE_PROVIDER_SITE_OTHER): Payer: 59 | Admitting: Nurse Practitioner

## 2023-12-23 VITALS — BP 110/80 | HR 77 | Ht 63.0 in | Wt 209.0 lb

## 2023-12-23 DIAGNOSIS — I10 Essential (primary) hypertension: Secondary | ICD-10-CM | POA: Diagnosis not present

## 2023-12-23 DIAGNOSIS — E782 Mixed hyperlipidemia: Secondary | ICD-10-CM

## 2023-12-23 DIAGNOSIS — E236 Other disorders of pituitary gland: Secondary | ICD-10-CM

## 2023-12-23 DIAGNOSIS — F413 Other mixed anxiety disorders: Secondary | ICD-10-CM

## 2023-12-23 DIAGNOSIS — Z0000 Encounter for general adult medical examination without abnormal findings: Secondary | ICD-10-CM

## 2023-12-23 DIAGNOSIS — Z9081 Acquired absence of spleen: Secondary | ICD-10-CM

## 2023-12-23 NOTE — Assessment & Plan Note (Signed)
 Stable at this time. Continue with relaxation techniques.

## 2023-12-23 NOTE — Assessment & Plan Note (Signed)
 Blood pressure is well-controlled with current medication regimen. She is compliant with medication and reports daily walking, contributing to blood pressure management. No headaches or vision changes related to hypertension. - Continue current antihypertensive medication regimen. - Encourage daily walking for cardiovascular health. - Monitor blood pressure regularly.

## 2023-12-23 NOTE — Assessment & Plan Note (Signed)
 Restart atorvastatin.
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
## 2023-12-23 NOTE — Assessment & Plan Note (Signed)

## 2023-12-23 NOTE — Assessment & Plan Note (Signed)
 Monitor labs

## 2023-12-23 NOTE — Assessment & Plan Note (Signed)
 Empty Sella Syndrome with concern this is potentially contributing to headaches and blurry vision. Neurology recommended a sleep study for further evaluation. No acute changes noted. - Schedule a sleep study as recommended by neurology. - Schedule an appointment with an ophthalmologist for evaluation of blurry vision.

## 2023-12-23 NOTE — Patient Instructions (Addendum)
 I have written a letter for extension until 01/02/2024 for your FMLA. If they need adjustment to the formal paperwork, they can send it to me and I will complete this.   You can send the other form through MyChart or fax [phone]) and I will fill that out for you.   Your next pap is due in 2027.  Your blood pressure looks great!!! Keep up the good work.

## 2023-12-24 LAB — COMPREHENSIVE METABOLIC PANEL WITH GFR
ALT: 8 IU/L (ref 0–32)
AST: 13 IU/L (ref 0–40)
Albumin: 3.6 g/dL — ABNORMAL LOW (ref 3.9–4.9)
Alkaline Phosphatase: 75 IU/L (ref 44–121)
BUN/Creatinine Ratio: 14 (ref 9–23)
BUN: 10 mg/dL (ref 6–24)
Bilirubin Total: 0.3 mg/dL (ref 0.0–1.2)
CO2: 23 mmol/L (ref 20–29)
Calcium: 8.9 mg/dL (ref 8.7–10.2)
Chloride: 103 mmol/L (ref 96–106)
Creatinine, Ser: 0.7 mg/dL (ref 0.57–1.00)
Globulin, Total: 2.8 g/dL (ref 1.5–4.5)
Glucose: 84 mg/dL (ref 70–99)
Potassium: 4.2 mmol/L (ref 3.5–5.2)
Sodium: 140 mmol/L (ref 134–144)
Total Protein: 6.4 g/dL (ref 6.0–8.5)
eGFR: 110 mL/min/{1.73_m2} (ref >59)

## 2023-12-24 LAB — IRON,TIBC AND FERRITIN PANEL
Ferritin: 7 ng/mL — ABNORMAL LOW (ref 15–150)
Iron Saturation: 9 % — CL (ref 15–55)
Iron: 36 ug/dL (ref 27–159)
Total Iron Binding Capacity: 423 ug/dL (ref 250–450)
UIBC: 387 ug/dL (ref 131–425)

## 2023-12-24 LAB — CBC WITH DIFFERENTIAL/PLATELET
Basophils Absolute: 0 10*3/uL (ref 0.0–0.2)
Basos: 1 %
EOS (ABSOLUTE): 0.1 10*3/uL (ref 0.0–0.4)
Eos: 2 %
Hematocrit: 32.7 % — ABNORMAL LOW (ref 34.0–46.6)
Hemoglobin: 10 g/dL — ABNORMAL LOW (ref 11.1–15.9)
Immature Grans (Abs): 0 10*3/uL (ref 0.0–0.1)
Immature Granulocytes: 0 %
Lymphocytes Absolute: 1 10*3/uL (ref 0.7–3.1)
Lymphs: 34 %
MCH: 28.2 pg (ref 26.6–33.0)
MCHC: 30.6 g/dL — ABNORMAL LOW (ref 31.5–35.7)
MCV: 92 fL (ref 79–97)
Monocytes Absolute: 0.4 10*3/uL (ref 0.1–0.9)
Monocytes: 14 %
Neutrophils Absolute: 1.4 10*3/uL (ref 1.4–7.0)
Neutrophils: 49 %
Platelets: 457 10*3/uL — ABNORMAL HIGH (ref 150–450)
RBC: 3.54 x10E6/uL — ABNORMAL LOW (ref 3.77–5.28)
RDW: 12.7 % (ref 11.7–15.4)
WBC: 3 10*3/uL — ABNORMAL LOW (ref 3.4–10.8)

## 2023-12-24 LAB — LIPID PANEL
Chol/HDL Ratio: 3.3 ratio (ref 0.0–4.4)
Cholesterol, Total: 231 mg/dL — ABNORMAL HIGH (ref 100–199)
HDL: 69 mg/dL (ref >39)
LDL Chol Calc (NIH): 149 mg/dL — ABNORMAL HIGH (ref 0–99)
Triglycerides: 74 mg/dL (ref 0–149)
VLDL Cholesterol Cal: 13 mg/dL (ref 5–40)

## 2023-12-24 LAB — VITAMIN D 25 HYDROXY (VIT D DEFICIENCY, FRACTURES): Vit D, 25-Hydroxy: 35.2 ng/mL (ref 30.0–100.0)

## 2023-12-28 ENCOUNTER — Other Ambulatory Visit: Payer: Self-pay

## 2023-12-28 ENCOUNTER — Encounter: Payer: Self-pay | Admitting: Nurse Practitioner

## 2023-12-28 ENCOUNTER — Ambulatory Visit: Payer: Self-pay | Admitting: Nurse Practitioner

## 2023-12-28 DIAGNOSIS — D509 Iron deficiency anemia, unspecified: Secondary | ICD-10-CM

## 2023-12-28 DIAGNOSIS — D75839 Thrombocytosis, unspecified: Secondary | ICD-10-CM

## 2023-12-28 DIAGNOSIS — D72819 Decreased white blood cell count, unspecified: Secondary | ICD-10-CM

## 2023-12-29 ENCOUNTER — Telehealth: Payer: Self-pay | Admitting: Nurse Practitioner

## 2023-12-29 ENCOUNTER — Ambulatory Visit: Payer: 59 | Admitting: Neurology

## 2023-12-29 ENCOUNTER — Encounter: Payer: Self-pay | Admitting: Neurology

## 2023-12-29 VITALS — BP 109/69 | HR 67 | Ht 62.5 in | Wt 214.8 lb

## 2023-12-29 DIAGNOSIS — E236 Other disorders of pituitary gland: Secondary | ICD-10-CM | POA: Diagnosis not present

## 2023-12-29 DIAGNOSIS — R519 Headache, unspecified: Secondary | ICD-10-CM | POA: Diagnosis not present

## 2023-12-29 DIAGNOSIS — Z9189 Other specified personal risk factors, not elsewhere classified: Secondary | ICD-10-CM

## 2023-12-29 NOTE — Patient Instructions (Signed)
 It was nice to see you again today.  As discussed during our previous visit and again today:  1. I would like for you to get a formal eye exam.  I recommend a full, dilated, eye exam including visual field testing, to see if there is any loss of peripheral vision. We can make a referral to ophthalmology if needed, otherwise you can see any optometrist or ophthalmologist of your choosing.  Please ask them to send records of their office visit to us . For now, you can go to [facility] in [place].  2.  Based on the eye evaluation, we may consider a LP (lumbar puncture/spinal tap) with pressure testing on your spinal fluid and routine fluid testing. Eventually, if the spinal fluid pressure is indeed elevated, we will consider starting you on a medication called diamox to help keep the spinal fluid pressure at bay.  Some people need more than 1 spinal tap over time. 3. We will do a sleep study to rule out obstructive sleep apnea.  If you have obstructive sleep apnea, I will want you to start treatment with a machine called CPAP or AutoPAP.  Treatment of obstructive sleep apnea can help headaches, also help with metabolism and weight loss, and ultimately, treatment of sleep apnea can reduce risk for cardiovascular complications including heart disease and stroke risk.

## 2023-12-29 NOTE — Telephone Encounter (Signed)
 [place] [facility] leave form emailed to pt , called pt and advised her form was emailed to her @ [email or web address]

## 2023-12-29 NOTE — Progress Notes (Signed)
 Subjective:    Patient ID: [name] is a 43 y.o. female.  HPI    Morgan Fairy, MD, PhD [facility] [address], [address] [address] [place], [place] [address]  [name] is a 44 year old female with an underlying medical history of hypertension, thrombocytosis, history of small bowel obstruction, anemia, status post splenectomy, history of postpartum subarachnoid hemorrhage, and obesity, who presents for follow-up consultation of her recurrent headaches.  The patient is unaccompanied today.  I first met her on 09/29/2023 at the request of the emergency room, at which time she reported a several month history of recurrent headaches.  A CT angiogram of the today, head from February 2025 had shown an empty sella finding.  She was advised to get a formal, dilated eye examination.  She was encouraged to talk to her PCP about a different medication for weight loss rather than phentermine.  She was advised to proceed with a sleep study.  She did not schedule sleep study yet. Sleep study coordinator called on 10/10/23 and 10/19/23.  Today, 12/29/2023: She reports feeling about the same, no new symptoms.  She gets a headache about once or twice a week, she takes Tylenol  or Excedrin Migraine as needed.  She is scheduled for her eye exam with her usual eye doctor in [place] on 01/02/2024.  She would be willing to schedule her sleep study.  She tries to hydrate well with water, estimates that she drinks about 4 bottles of water per day.  She limits her caffeine, does not have daily caffeine, drinks decaf green tea.  Previously:   09/29/2023: (She) reports recurrent headaches for the past few months.  Her headaches are typically not severe or throbbing, sometimes in the front, typically no associated nausea or vomiting but does feel dizzy sometimes, denies vertiginous symptoms such as spinning sensation or lightheadedness such as headache feeling however. She has  not had any caffeine since December 2024.  She stopped taking her phentermine about a month ago.  She is a non-smoker.  She does not drink any alcohol.  She has never had a sleep study.  She is not aware of any snoring and not aware of any family history of sleep apnea.  She does have occasional blurry vision when she stares at the computer screen too long.  She denies any double vision or loss of vision.  Her last eye examination has been about 2 years ago at [facility]. Her Epworth sleepiness score is 4 out of 24, fatigue severity score is 9 out of 63. She presented to the emergency room on 09/24/2023 with URI symptoms and headaches.  I have reviewed the emergency room records.  She was found to have a headache in the context of flulike illness.   She had a CT angiogram of the head with and without contrast on 09/24/2023 and I reviewed the results:  IMPRESSION: 1. Negative CTA Head. 2. Normal CT appearance of the brain aside from chronic partially empty sella, effaced appearance of the transverse and sigmoid venous sinus junctions. These can be normal anatomic variant, but also are associated with idiopathic intracranial hypertension (pseudotumor cerebri).     In addition, I personally and independently reviewed images through the PACS system.  I agree with finding of partially empty sella.   She had a brain MRI with and without contrast on 03/27/2008 and I reviewed the results:      IMPRESSION:  Symmetric subarachnoid hemorrhage in the occipital and  parietal  regions.  No explanation for subarachnoid hemorrhage is identified  on the MRI.    In addition, I personally and independently reviewed images through the PACS system.    She had a CT angiogram of the head with and without contrast on 03/24/2008 and I reviewed the results:      IMPRESSION:    1. No evidence for significant aneurysm, stenosis, or branch  vessel occlusion.  2.  No focal lesion to explain subarachnoid  hemorrhage.    In addition, I personally and independently reviewed images through the PACS system.   She had a head CT without contrast on 03/24/2008 and I reviewed the results: IMPRESSION:    1. Small subarachnoid hemorrhage near the vertex.  No underlying  cause is identified. Query aneurysm. Critical test results  telephoned to Dr. Bryna Car at the time of interpretation on date  03/24/2008 at time 08:39 a.m.   Her Past Medical History Is Significant For: Past Medical History:  Diagnosis Date   Abdominal pain 02/14/2020   Anemia 08/21/2020   Aneurysm (HCC)    Costochondral chest pain 09/22/2020   ERRONEOUS ENCOUNTER--DISREGARD 08/05/2020   H/O splenectomy 08/21/2020   Hypertension    Hypertension    Sebaceous cyst of right axilla 11/03/2015   Small bowel obstruction (HCC) 02/14/2020   Thrombocytosis 08/21/2020    Her Past Surgical History Is Significant For: Past Surgical History:  Procedure Laterality Date   SPLENECTOMY, TOTAL      Her Family History Is Significant For: Family History  Problem Relation Age of Onset   Diabetes Mother    Hypertension Mother    Diabetes Father    Hypertension Father    Asthma Daughter     Her Social History Is Significant For: Social History   Socioeconomic History   Marital status: Married    Spouse name: Not on file   Number of children: 3   Years of education: Not on file   Highest education level: Not on file  Occupational History   Not on file  Tobacco Use   Smoking status: Never   Smokeless tobacco: Never  Vaping Use   Vaping status: Never Used  Substance and Sexual Activity   Alcohol use: No    Alcohol/week: 0.0 standard drinks of alcohol   Drug use: No   Sexual activity: Yes    Birth control/protection: Pill  Other Topics Concern   Not on file  Social History Narrative   Not on file   Social Drivers of Health   Financial Resource Strain: Low Risk  (05/07/2021)   Overall Financial Resource Strain (CARDIA)     Difficulty of Paying Living Expenses: Not hard at all  Food Insecurity: No Food Insecurity (05/07/2021)   Hunger Vital Sign    Worried About Running Out of Food in the Last Year: Never true    Ran Out of Food in the Last Year: Never true  Transportation Needs: No Transportation Needs (05/07/2021)   PRAPARE - Administrator, Civil Service (Medical): No    Lack of Transportation (Non-Medical): No  Physical Activity: Sufficiently Active (05/07/2021)   Exercise Vital Sign    Days of Exercise per Week: 6 days    Minutes of Exercise per Session: 60 min  Stress: No Stress Concern Present (05/07/2021)   [employer] of Occupational Health - Occupational Stress Questionnaire    Feeling of Stress : Not at all  Social Connections: Socially Integrated (05/07/2021)   Social Connection and Isolation Panel [NHANES]  Frequency of Communication with Friends and Family: More than three times a week    Frequency of Social Gatherings with Friends and Family: Once a week    Attends Religious Services: More than 4 times per year    Active Member of [employer] or Organizations: Yes    Attends Banker Meetings: 1 to 4 times per year    Marital Status: Married    Her Allergies Are:  No Known Allergies:   Her Current Medications Are:  Outpatient Encounter Medications as of 12/29/2023  Medication Sig   hydrochlorothiazide  (HYDRODIURIL ) 25 MG tablet Take 1 tablet (25 mg total) by mouth daily.   Multiple Vitamin (MULTIVITAMIN WITH MINERALS) TABS tablet Take 1 tablet by mouth daily.   Norgestimate -Eth Estradiol  (TRI-LO-ESTARYLLA) 0.18/0.215/0.25 MG-25 MCG TABS Take 1 tablet by mouth daily.   atorvastatin  (LIPITOR) 10 MG tablet Take 1 tablet (10 mg total) by mouth at bedtime. (Patient not taking: Reported on 12/12/2023)   No facility-administered encounter medications on file as of 12/29/2023.  :   Review of Systems:  Out of a complete 14 point review of systems, all are reviewed and  negative with the exception of these symptoms as listed below:   Review of Systems  Neurological:        Pt in room 5. Alone.Here for Empty sell follow up. Patient never scheduled sleep study. Still has headaches about twice per week, blurry vision, takes tylenol  or Excedrin for headaches. Pt scheduled for eye exam on 01/02/24.     Objective:  Neurological Exam  Physical Exam Physical Examination:   Vitals:   12/29/23 0942  BP: 109/69  Pulse: 67    General Examination: The patient is a very pleasant 43 y.o. female in no acute distress. She appears well-developed and well-nourished and well groomed.   HEENT: Normocephalic, atraumatic, pupils are equal, round and reactive to light, funduscopic exam benign, no obvious papilledema, no photophobia.  Extraocular tracking is good without limitation to gaze excursion or nystagmus noted. Hearing is grossly intact. Face is symmetric with normal facial animation. Speech is clear with no dysarthria noted. There is no hypophonia. There is no lip, neck/head, jaw or voice tremor. Neck is supple with full range of passive and active motion. There are no carotid bruits on auscultation. Oropharynx exam reveals: mild mouth dryness, good dental hygiene and moderate airway crowding.  Tongue protrudes centrally and palate elevates symmetrically.   Chest: Clear to auscultation without wheezing, rhonchi or crackles noted.   Heart: S1+S2+0, regular and normal without murmurs, rubs or gallops noted.    Abdomen: Soft, non-tender and non-distended.   Extremities: There is no pitting edema in the distal lower extremities bilaterally.    Skin: Warm and dry without trophic changes noted.    Musculoskeletal: exam reveals no obvious joint deformities.    Neurologically:  Mental status: The patient is awake, alert and oriented in all 4 spheres. Her immediate and remote memory, attention, language skills and fund of knowledge are appropriate. There is no evidence of  aphasia, agnosia, apraxia or anomia. Speech is clear with normal prosody and enunciation. Thought process is linear. Mood is normal and affect is normal.  Cranial nerves II - XII are as described above under HEENT exam.  Motor exam: Normal bulk, strength and tone is noted. There is no obvious action or resting tremor.  No drift or rebound. Fine motor skills and coordination: intact finger taps, hand movements and rapid alternating patting with both upper extremities, normal foot  taps bilaterally in the lower extremities.  Cerebellar testing: No dysmetria or intention tremor. There is no truncal or gait ataxia.  Normal finger-to-nose, normal heel-to-shin bilaterally. Sensory exam: intact to light touch in the upper and lower extremities.  Reflexes 2+ throughout, toes are downgoing bilaterally. Romberg negative. Gait, station and balance: She stands easily. No veering to one side is noted. No leaning to one side is noted. Posture is age-appropriate and stance is narrow based. Gait shows normal stride length and normal pace. No problems turning are noted.  Tandem walk normal.   Assessment and plan:  In summary, Amber Williard is a 43 year old female with an underlying medical history of hypertension, thrombocytosis, history of small bowel obstruction, anemia, status post splenectomy, history of postpartum subarachnoid hemorrhage, and obesity, who presents for follow-up consultation of her recurrent headaches.  She was found to have an empty sella on a CT angiogram of the head back in February 2025.  Neurological exam is nonfocal and she is reassured. Below is a summary of my recommendations and discussion points from today's visit.  She was given these instructions verbally and also in detail in writing in her after visit summary which she indicated she could access electronically through MyChart:  << 1. I would like for you to get a formal eye exam.  I recommend a full, dilated, eye exam  including visual field testing, to see if there is any loss of peripheral vision. We can make a referral to ophthalmology if needed, otherwise you can see any optometrist or ophthalmologist of your choosing.  Please ask them to send records of their office visit to us . For now, you can go to [facility] in [place].  2.  Based on the eye evaluation, we may consider a LP (lumbar puncture/spinal tap) with pressure testing on your spinal fluid and routine fluid testing. Eventually, if the spinal fluid pressure is indeed elevated, we will consider starting you on a medication called diamox to help keep the spinal fluid pressure at bay.  Some people need more than 1 spinal tap over time. 3. We will do a sleep study to rule out obstructive sleep apnea.  If you have obstructive sleep apnea, I will want you to start treatment with a machine called CPAP or AutoPAP.  Treatment of obstructive sleep apnea can help headaches, also help with metabolism and weight loss, and ultimately, treatment of sleep apnea can reduce risk for cardiovascular complications including heart disease and stroke risk.>>  We will plan a follow-up after testing and after her eye examination.  I answered all her questions today and she was in agreement. I spent 30 minutes in total face-to-face time and in reviewing records during pre-charting, more than 50% of which was spent in counseling and coordination of care, reviewing test results, reviewing medications and treatment regimen and/or in discussing or reviewing the diagnosis of recurrent headaches, the prognosis and treatment options. Pertinent laboratory and imaging test results that were available during this visit with the patient were reviewed by me and considered in my medical decision making (see chart for details).

## 2024-01-03 NOTE — Telephone Encounter (Signed)
 The authorization expired on 12/28/23.  Pending new auth from [facility] for [employer].

## 2024-01-05 NOTE — Telephone Encounter (Signed)
 Called patient to schedule her SS. She did not pick up and left her a voicemail to call back. Left my direct number.

## 2024-01-05 NOTE — Telephone Encounter (Signed)
 [employer] new Siegfried DressAilene [place] Siegfried Dress: [id number] (exp. 01/03/24 to 04/02/24)

## 2024-01-25 NOTE — Telephone Encounter (Signed)
 Tried calling the patient again, she didn't pick up and I wasn't able to leave vmail.

## 2024-05-31 ENCOUNTER — Telehealth: Payer: Self-pay

## 2024-05-31 NOTE — Telephone Encounter (Signed)
 MyChart message to sent to pt. That her CPE notes have been emailed to the requested email address.    Copied from CRM [id number]. Topic: General - Other >> May 31, 2024  3:15 PM Santiya F wrote: Reason for CRM: Patient is calling in wanting to know if she can get a printout of her physical and what was done. Patient's physical was 12/23/23. Patient is requesting it be emailed if possible. [email or web address]

## 2024-06-25 ENCOUNTER — Encounter: Payer: Self-pay | Admitting: Nurse Practitioner

## 2024-07-20 ENCOUNTER — Other Ambulatory Visit: Payer: Self-pay | Admitting: Nurse Practitioner

## 2024-07-20 DIAGNOSIS — I10 Essential (primary) hypertension: Secondary | ICD-10-CM

## 2024-07-20 NOTE — Telephone Encounter (Unsigned)
 Copied from CRM #[id number]. Topic: Clinical - Medication Refill >> Jul 20, 2024 11:12 AM Ahlexyia S wrote: Medication: hydrochlorothiazide  (HYDRODIURIL ) 25 MG tablet  Has the patient contacted their pharmacy? Yes, pt was told that there are no refills remaining. (Agent: If no, request that the patient contact the pharmacy for the refill. If patient does not wish to contact the pharmacy document the reason why and proceed with request.) (Agent: If yes, when and what did the pharmacy advise?)  This is the patient's preferred pharmacy:  [facility] - [place], [place] - [address] [place] [address] Phone: [phone] Fax: [phone]  Is this the correct pharmacy for this prescription? Yes If no, delete pharmacy and type the correct one.   Has the prescription been filled recently? No  Is the patient out of the medication? Yes  Has the patient been seen for an appointment in the last year OR does the patient have an upcoming appointment? Yes  Can we respond through MyChart? No, prefers phone calls.  Agent: Please be advised that Rx refills may take up to 3 business days. We ask that you follow-up with your pharmacy.

## 2024-07-23 MED ORDER — HYDROCHLOROTHIAZIDE 25 MG PO TABS: 25.0000 mg | ORAL_TABLET | Freq: Every day | ORAL | 1 refills | Status: AC

## 2025-01-01 ENCOUNTER — Encounter: Payer: Self-pay | Admitting: Nurse Practitioner
